# Patient Record
Sex: Female | Born: 1980 | Race: Black or African American | Hispanic: No
Health system: Midwestern US, Community
[De-identification: ages and names within clinical notes are randomized; demographics above are authoritative.]

## PROBLEM LIST (undated history)

## (undated) ENCOUNTER — Inpatient Hospital Stay (HOSPITAL_COMMUNITY): Payer: Self-pay

## (undated) DIAGNOSIS — R87629 Unspecified abnormal cytological findings in specimens from vagina: Secondary | ICD-10-CM

## (undated) DIAGNOSIS — A599 Trichomoniasis, unspecified: Secondary | ICD-10-CM

## (undated) DIAGNOSIS — Z8632 Personal history of gestational diabetes: Secondary | ICD-10-CM

## (undated) HISTORY — PX: WISDOM TOOTH EXTRACTION: SHX21

## (undated) HISTORY — DX: Personal history of gestational diabetes: Z86.32

## (undated) HISTORY — DX: Trichomoniasis, unspecified: A59.9

---

## 2010-11-22 DIAGNOSIS — O159 Eclampsia, unspecified as to time period: Secondary | ICD-10-CM

## 2015-09-26 LAB — OB RESULTS CONSOLE RUBELLA ANTIBODY, IGM: Rubella: IMMUNE

## 2015-09-26 LAB — OB RESULTS CONSOLE HIV ANTIBODY (ROUTINE TESTING): HIV: NONREACTIVE

## 2015-09-26 LAB — OB RESULTS CONSOLE ABO/RH: RH TYPE: POSITIVE

## 2015-09-26 LAB — OB RESULTS CONSOLE HGB/HCT, BLOOD
HCT: 39 %
Hemoglobin: 12.8 g/dL

## 2015-09-26 LAB — OB RESULTS CONSOLE PLATELET COUNT: PLATELETS: 235 10*3/uL

## 2015-09-26 LAB — OB RESULTS CONSOLE RPR: RPR: NONREACTIVE

## 2015-09-26 LAB — OB RESULTS CONSOLE GC/CHLAMYDIA
Chlamydia: NEGATIVE
Gonorrhea: NEGATIVE

## 2015-09-26 LAB — OB RESULTS CONSOLE HEPATITIS B SURFACE ANTIGEN: HEP B S AG: NEGATIVE

## 2015-09-26 LAB — OB RESULTS CONSOLE ANTIBODY SCREEN: Antibody Screen: NEGATIVE

## 2015-09-29 ENCOUNTER — Encounter: Payer: Self-pay | Admitting: *Deleted

## 2015-09-29 DIAGNOSIS — G43909 Migraine, unspecified, not intractable, without status migrainosus: Secondary | ICD-10-CM | POA: Insufficient documentation

## 2015-09-29 DIAGNOSIS — O4690 Antepartum hemorrhage, unspecified, unspecified trimester: Secondary | ICD-10-CM | POA: Insufficient documentation

## 2015-09-29 DIAGNOSIS — O09219 Supervision of pregnancy with history of pre-term labor, unspecified trimester: Secondary | ICD-10-CM

## 2015-09-29 DIAGNOSIS — Z8759 Personal history of other complications of pregnancy, childbirth and the puerperium: Secondary | ICD-10-CM

## 2015-09-29 DIAGNOSIS — O09899 Supervision of other high risk pregnancies, unspecified trimester: Secondary | ICD-10-CM

## 2015-09-29 DIAGNOSIS — O0990 Supervision of high risk pregnancy, unspecified, unspecified trimester: Secondary | ICD-10-CM | POA: Insufficient documentation

## 2015-09-29 DIAGNOSIS — Z8751 Personal history of pre-term labor: Secondary | ICD-10-CM | POA: Insufficient documentation

## 2015-10-11 NOTE — L&D Delivery Note (Signed)
Operative Delivery Note Called at 1:30 PM for repetitive variables since 1:14 PM not resolving w/ position change. At 1:44 PM anterior lip reduced and pushing started although pt not able to push well due to heavy epidural. At 2:10 PM a viable female was delivered via Vaginal, Spontaneous Delivery w/ 1 minute 20 second shoulder dystocia.  Presentation: vertex; Position: Right,, Occiput,, Anterior   Delivery of the head: 2:08 PM First maneuver: Gentle downward head traction   Second maneuver: McRoberts Third maneuver: Suprapubic pressure. NICU team called.  Fourth maneuver: Attempted wood screw maneuver, but unable to rotate shoulders. (Attending called, but called off due to successful delivery of infant a few seconds later.) Fifth maneuver: Hooked right axilla, rotated right shoulder clockwise after two attempts.  Sixth maneuver: Deliverer posterior (right) arm  Pt unable to to push effectively throughout entire event. Infant briefly placed skin-to-skin w/ mom, breathing, but did not transition quickly w/ stimulation and suctioning so cord clamped x 2 and cut and infant placed in warmer. See NICU note. Moving both arms well.   APGAR: 8, 9; weight 7 lb 11.6 oz (3505 g).   Placenta status: Intact, Spontaneous.   Cord: 3 vessels with the following complications: None.  Cord pH: collected.   Anesthesia: Epidural  Episiotomy: None Lacerations: None Suture Repair: NA Est. Blood Loss (mL): 300  Mom to postpartum.  Baby to Couplet care / Skin to Skin. Placenta to: pathology Feeding: Bottle Circ: NA Contraception: Changed her mind about BTL. Now planning Nexplanon.   Katrina Howard 03/27/2016, 8:04 AM

## 2015-10-13 ENCOUNTER — Encounter: Payer: Self-pay | Admitting: Family Medicine

## 2015-10-14 ENCOUNTER — Encounter: Payer: Self-pay | Admitting: Certified Nurse Midwife

## 2015-10-14 ENCOUNTER — Ambulatory Visit (INDEPENDENT_AMBULATORY_CARE_PROVIDER_SITE_OTHER): Payer: Medicaid Other | Admitting: Certified Nurse Midwife

## 2015-10-14 VITALS — BP 109/71 | HR 81 | Temp 98.2°F | Ht 65.0 in | Wt 183.0 lb

## 2015-10-14 DIAGNOSIS — O0992 Supervision of high risk pregnancy, unspecified, second trimester: Secondary | ICD-10-CM

## 2015-10-14 DIAGNOSIS — Z8759 Personal history of other complications of pregnancy, childbirth and the puerperium: Secondary | ICD-10-CM

## 2015-10-14 LAB — POCT URINALYSIS DIP (DEVICE)
Bilirubin Urine: NEGATIVE
GLUCOSE, UA: NEGATIVE mg/dL
Hgb urine dipstick: NEGATIVE
KETONES UR: NEGATIVE mg/dL
Leukocytes, UA: NEGATIVE
Nitrite: NEGATIVE
PROTEIN: NEGATIVE mg/dL
Specific Gravity, Urine: 1.025 (ref 1.005–1.030)
Urobilinogen, UA: 0.2 mg/dL (ref 0.0–1.0)
pH: 5.5 (ref 5.0–8.0)

## 2015-10-14 NOTE — Patient Instructions (Signed)

## 2015-10-14 NOTE — Progress Notes (Signed)
   Subjective:    Katrina Howard is a [id number] being seen today for her first obstetrical visit.  Her obstetrical history is significant for pre-eclampsia and Preterm delivery @ 36 weeks and abruption @ 20 weeks. Patient does intend to breast feed. Pregnancy history fully reviewed.  Patient reports no complaints.  Filed Vitals:   10/14/15 0857 10/14/15 0858  BP: 109/71   Pulse: 81   Temp: 98.2 F (36.8 C)   Height:  5\' 5"  (1.651 m)  Weight: 183 lb (83.008 kg)     HISTORY: OB History  Gravida Para Term Preterm AB SAB TAB Ectopic Multiple Living  4 3 1 2  0 0 0 0 0 2    # Outcome Date GA Lbr Len/2nd Weight Sex Delivery Anes PTL Lv  4 Current           3 Term 11/22/10 723w0d  5 lb 13 oz (2.637 kg) M Vag-Spont EPI N Y     Complications: Eclampsia (toxemia of pregnancy)     Comments: seizures during labor  2 Preterm 12/24/06 287w0d  6 lb 15 oz (3.147 kg) M Vag-Spont EPI Y Y  1 Preterm 06/11/03 4826w0d  1 lb (0.454 kg) M Vag-Spont Other Y FD     Complications: Abruptio Placenta     History reviewed. No pertinent past medical history. History reviewed. No pertinent past surgical history. Family History  Problem Relation Age of Onset  . Hypertension Mother   . Diabetes Father      Exam    Uterus:     Pelvic Exam:    Perineum:    Vulva:    Vagina:     pH:    Cervix:    Adnexa:    Bony Pelvis:   System: Breast:     Skin: normal coloration and turgor, no rashes    Neurologic: oriented, normal   Extremities: normal strength, tone, and muscle mass   HEENT    Mouth/Teeth mucous membranes moist, pharynx normal without lesions   Neck supple and no masses   Cardiovascular: regular rate and rhythm   Respiratory:  appears well, vitals normal, no respiratory distress, acyanotic, normal RR, ear and throat exam is normal, neck free of mass or lymphadenopathy, chest clear, no wheezing, crepitations, rhonchi, normal symmetric air entry   Abdomen: soft, non-tender; bowel sounds  normal; no masses,  no organomegaly   Urinary:       Assessment:    Pregnancy: [id number] Patient Active Problem List   Diagnosis Date Noted  . Supervision of high-risk pregnancy 09/29/2015  . History of preterm delivery, currently pregnant 09/29/2015  . History of eclampsia 09/29/2015  . Migraine 09/29/2015  . Vaginal bleeding during pregnancy, antepartum 09/29/2015        Plan:      Prenatal vitamins. Problem list reviewed and updated. Genetic Screening discussed Quad Screen: ordered.  Ultrasound discussed; fetal survey: ordered.  Follow up in 4 weeks. 50% of 30 min visit spent on counseling and coordination of care.  Candidate for 17-P; Pt wants to Baseline Preeclampsia labs CBC  CMP 24 hour urine   Katrina Howard 10/14/2015

## 2015-10-14 NOTE — Progress Notes (Signed)
Patient reports recent spotting & off and on cramping/pressure

## 2015-10-14 NOTE — Assessment & Plan Note (Signed)
Baseline Preeclampsia labs 10/14/15

## 2015-10-15 LAB — AFP, QUAD SCREEN
AFP: 66.4 ng/mL
Age Alone: 1:507 {titer}
Curr Gest Age: 15.4 wks.days
Down Syndrome Scr Risk Est: <1:38500 {titer}
HCG, Total: 27.85 IU/mL
INH: 156 pg/mL
Interpretation-AFP: NEGATIVE
MoM for AFP: 2.07
MoM for INH: 0.95
MoM for hCG: 0.67
Open Spina bifida: NEGATIVE
Osb Risk: 1:1270 {titer}
Tri 18 Scr Risk Est: NEGATIVE
Trisomy 18 (Edward) Syndrome Interp.: 1:17700 {titer}
uE3 Mom: 0.87
uE3 Value: 0.62 ng/mL

## 2015-10-21 ENCOUNTER — Ambulatory Visit: Payer: Medicaid Other

## 2015-10-21 VITALS — BP 111/66 | HR 70 | Temp 97.8°F

## 2015-10-21 DIAGNOSIS — Z3490 Encounter for supervision of normal pregnancy, unspecified, unspecified trimester: Secondary | ICD-10-CM

## 2015-10-21 LAB — CBC
HCT: 36.6 % (ref 36.0–46.0)
Hemoglobin: 12.2 g/dL (ref 12.0–15.0)
MCH: 26.6 pg (ref 26.0–34.0)
MCHC: 33.3 g/dL (ref 30.0–36.0)
MCV: 79.9 fL (ref 78.0–100.0)
MPV: 10.3 fL (ref 8.6–12.4)
Platelets: 231 10*3/uL (ref 150–400)
RBC: 4.58 MIL/uL (ref 3.87–5.11)
RDW: 14.5 % (ref 11.5–15.5)
WBC: 7.3 10*3/uL (ref 4.0–10.5)

## 2015-10-21 LAB — COMPREHENSIVE METABOLIC PANEL
ALT: 16 U/L (ref 6–29)
AST: 17 U/L (ref 10–30)
Albumin: 3.3 g/dL — ABNORMAL LOW (ref 3.6–5.1)
Alkaline Phosphatase: 48 U/L (ref 33–115)
BUN: 7 mg/dL (ref 7–25)
CO2: 26 mmol/L (ref 20–31)
Calcium: 8.9 mg/dL (ref 8.6–10.2)
Chloride: 103 mmol/L (ref 98–110)
Creat: 0.76 mg/dL (ref 0.50–1.10)
Glucose, Bld: 74 mg/dL (ref 65–99)
Potassium: 4 mmol/L (ref 3.5–5.3)
Sodium: 135 mmol/L (ref 135–146)
Total Bilirubin: 0.3 mg/dL (ref 0.2–1.2)
Total Protein: 6.5 g/dL (ref 6.1–8.1)

## 2015-10-21 MED ORDER — HYDROXYPROGESTERONE CAPROATE 250 MG/ML IM OIL
250.0000 mg | TOPICAL_OIL | Freq: Once | INTRAMUSCULAR | Status: AC
  Administered 2015-10-21: 250 mg via INTRAMUSCULAR

## 2015-10-23 LAB — PROTEIN, URINE, 24 HOUR
Protein, 24H Urine: 143 mg/24 h (ref <150)
Protein, Urine: 15 mg/dL (ref 5–24)

## 2015-10-23 LAB — CREATININE CLEARANCE, URINE, 24 HOUR
Creatinine Clearance: 155 mL/min — ABNORMAL HIGH (ref 75–115)
Creatinine, 24H Ur: 1.65 g/(24.h) (ref 0.63–2.50)
Creatinine, Urine: 174 mg/dL (ref 20–320)
Creatinine: 0.74 mg/dL (ref 0.50–1.10)

## 2015-10-28 ENCOUNTER — Ambulatory Visit (INDEPENDENT_AMBULATORY_CARE_PROVIDER_SITE_OTHER): Payer: Medicaid Other | Admitting: General Practice

## 2015-10-28 VITALS — BP 114/63 | HR 86 | Temp 98.2°F | Ht 65.0 in | Wt 188.0 lb

## 2015-10-28 DIAGNOSIS — O09212 Supervision of pregnancy with history of pre-term labor, second trimester: Secondary | ICD-10-CM | POA: Diagnosis present

## 2015-10-28 DIAGNOSIS — O0992 Supervision of high risk pregnancy, unspecified, second trimester: Secondary | ICD-10-CM

## 2015-10-28 MED ORDER — HYDROXYPROGESTERONE CAPROATE 250 MG/ML IM OIL
250.0000 mg | TOPICAL_OIL | INTRAMUSCULAR | Status: AC
  Administered 2015-10-28 – 2016-03-03 (×19): 250 mg via INTRAMUSCULAR

## 2015-11-04 ENCOUNTER — Ambulatory Visit (INDEPENDENT_AMBULATORY_CARE_PROVIDER_SITE_OTHER): Payer: Medicaid Other | Admitting: General Practice

## 2015-11-04 DIAGNOSIS — O09212 Supervision of pregnancy with history of pre-term labor, second trimester: Secondary | ICD-10-CM | POA: Diagnosis present

## 2015-11-11 ENCOUNTER — Ambulatory Visit (INDEPENDENT_AMBULATORY_CARE_PROVIDER_SITE_OTHER): Payer: Medicaid Other | Admitting: Certified Nurse Midwife

## 2015-11-11 ENCOUNTER — Ambulatory Visit (HOSPITAL_COMMUNITY)
Admission: RE | Admit: 2015-11-11 | Discharge: 2015-11-11 | Disposition: A | Discharge status: Home / Self Care | Payer: Medicaid Other | Source: Ambulatory Visit | Attending: Certified Nurse Midwife | Admitting: Certified Nurse Midwife

## 2015-11-11 ENCOUNTER — Other Ambulatory Visit: Payer: Self-pay | Admitting: General Practice

## 2015-11-11 VITALS — BP 106/62 | HR 75 | Temp 98.3°F | Wt 189.3 lb

## 2015-11-11 DIAGNOSIS — Z8759 Personal history of other complications of pregnancy, childbirth and the puerperium: Secondary | ICD-10-CM

## 2015-11-11 DIAGNOSIS — Z3689 Encounter for other specified antenatal screening: Secondary | ICD-10-CM

## 2015-11-11 DIAGNOSIS — O09212 Supervision of pregnancy with history of pre-term labor, second trimester: Secondary | ICD-10-CM

## 2015-11-11 DIAGNOSIS — Z3A19 19 weeks gestation of pregnancy: Secondary | ICD-10-CM | POA: Diagnosis not present

## 2015-11-11 DIAGNOSIS — O0992 Supervision of high risk pregnancy, unspecified, second trimester: Secondary | ICD-10-CM

## 2015-11-11 DIAGNOSIS — Z36 Encounter for antenatal screening of mother: Secondary | ICD-10-CM | POA: Insufficient documentation

## 2015-11-11 DIAGNOSIS — O09292 Supervision of pregnancy with other poor reproductive or obstetric history, second trimester: Secondary | ICD-10-CM

## 2015-11-11 DIAGNOSIS — O09892 Supervision of other high risk pregnancies, second trimester: Secondary | ICD-10-CM

## 2015-11-11 LAB — POCT URINALYSIS DIP (DEVICE)
Bilirubin Urine: NEGATIVE
Glucose, UA: NEGATIVE mg/dL
Hgb urine dipstick: NEGATIVE
Ketones, ur: NEGATIVE mg/dL
Leukocytes, UA: NEGATIVE
Nitrite: NEGATIVE
Protein, ur: NEGATIVE mg/dL
Specific Gravity, Urine: 1.01 (ref 1.005–1.030)
Urobilinogen, UA: 0.2 mg/dL (ref 0.0–1.0)
pH: 7 (ref 5.0–8.0)

## 2015-11-11 NOTE — Progress Notes (Signed)
Subjective:  Katrina Howard is a 35 y.o. [id number] at [redacted]w[redacted]d being seen today for ongoing prenatal care.  She is currently monitored for the following issues for this high-risk pregnancy and has Supervision of high-risk pregnancy; History of preterm delivery, currently pregnant; History of eclampsia; Migraine; and Vaginal bleeding during pregnancy, antepartum on her problem list.  Patient reports no complaints.  Contractions: Irritability. Vag. Bleeding: None.  Movement: Present. Denies leaking of fluid.   The following portions of the patient's history were reviewed and updated as appropriate: allergies, current medications, past family history, past medical history, past social history, past surgical history and problem list. Problem list updated.  Objective:   Filed Vitals:   11/11/15 0951  BP: 106/62  Pulse: 75  Temp: 98.3 F (36.8 C)  Weight: 189 lb 4.8 oz (85.866 kg)    Fetal Status: Fetal Heart Rate (bpm): 145   Movement: Present     General:  Alert, oriented and cooperative. Patient is in no acute distress.  Skin: Skin is warm and dry. No rash noted.   Cardiovascular: Normal heart rate noted  Respiratory: Normal respiratory effort, no problems with respiration noted  Abdomen: Soft, gravid, appropriate for gestational age. Pain/Pressure: Present     Pelvic: Vag. Bleeding: None     Cervical exam deferred        Extremities: Normal range of motion.  Edema: None  Mental Status: Normal mood and affect. Normal behavior. Normal judgment and thought content.   Urinalysis: Urine Protein: Negative Urine Glucose: Negative  Assessment and Plan:  Pregnancy: [id number] at [redacted]w[redacted]d  1. History of eclampsia   2. History of preterm delivery, currently pregnant, second trimester 17-P injection  3. Supervision of high-risk pregnancy, second trimester   Preterm labor symptoms and general obstetric precautions including but not limited to vaginal bleeding, contractions, leaking of fluid and  fetal movement were reviewed in detail with the patient. Please refer to After Visit Summary for other counseling recommendations.  Return in about 4 weeks (around 12/09/2015).   Rhea Pink, CNM

## 2015-11-11 NOTE — Patient Instructions (Signed)

## 2015-11-11 NOTE — Progress Notes (Signed)
Breastfeeding tip of the week reviewed 17-p injection today

## 2015-11-19 ENCOUNTER — Ambulatory Visit (INDEPENDENT_AMBULATORY_CARE_PROVIDER_SITE_OTHER): Payer: Medicaid Other

## 2015-11-19 DIAGNOSIS — O09212 Supervision of pregnancy with history of pre-term labor, second trimester: Secondary | ICD-10-CM

## 2015-11-19 DIAGNOSIS — O09892 Supervision of other high risk pregnancies, second trimester: Secondary | ICD-10-CM

## 2015-11-26 ENCOUNTER — Ambulatory Visit (INDEPENDENT_AMBULATORY_CARE_PROVIDER_SITE_OTHER): Payer: Medicaid Other | Admitting: *Deleted

## 2015-11-26 VITALS — BP 111/59 | HR 88 | Temp 98.3°F | Wt 193.4 lb

## 2015-11-26 DIAGNOSIS — O09892 Supervision of other high risk pregnancies, second trimester: Secondary | ICD-10-CM

## 2015-11-26 DIAGNOSIS — O09212 Supervision of pregnancy with history of pre-term labor, second trimester: Secondary | ICD-10-CM | POA: Diagnosis present

## 2015-12-03 ENCOUNTER — Ambulatory Visit (INDEPENDENT_AMBULATORY_CARE_PROVIDER_SITE_OTHER): Payer: Medicaid Other | Admitting: *Deleted

## 2015-12-03 VITALS — BP 95/67 | HR 82 | Temp 98.5°F | Wt 191.2 lb

## 2015-12-03 DIAGNOSIS — O09892 Supervision of other high risk pregnancies, second trimester: Secondary | ICD-10-CM

## 2015-12-03 DIAGNOSIS — O09212 Supervision of pregnancy with history of pre-term labor, second trimester: Secondary | ICD-10-CM

## 2015-12-07 ENCOUNTER — Telehealth: Payer: Self-pay | Admitting: General Practice

## 2015-12-07 NOTE — Telephone Encounter (Signed)
Patient called and left message stating she is calling for a prescription for oral thrush. Patient states she currently has thrush in her mouth. Called patient and she states her mouth hurts really bad and she has a thick white coating in her mouth. Told patient she would need to see a provider for that for them to do an exam. Told patient we cannot send a medication in for that without seeing her. Patient states she cannot wait till her appt on Thursday. Offered nurse visit today or she could go to an urgent care closer to where she lives. Patient verbalized understanding & states she will go to an urgent care. Patient had no other questions

## 2015-12-10 ENCOUNTER — Ambulatory Visit (INDEPENDENT_AMBULATORY_CARE_PROVIDER_SITE_OTHER): Payer: Medicaid Other | Admitting: Obstetrics & Gynecology

## 2015-12-10 ENCOUNTER — Encounter: Payer: Self-pay | Admitting: Obstetrics & Gynecology

## 2015-12-10 ENCOUNTER — Other Ambulatory Visit: Payer: Self-pay | Admitting: Obstetrics & Gynecology

## 2015-12-10 VITALS — BP 107/66 | HR 76 | Temp 98.5°F | Wt 193.4 lb

## 2015-12-10 DIAGNOSIS — O4412 Placenta previa with hemorrhage, second trimester: Secondary | ICD-10-CM | POA: Diagnosis not present

## 2015-12-10 DIAGNOSIS — O09212 Supervision of pregnancy with history of pre-term labor, second trimester: Secondary | ICD-10-CM

## 2015-12-10 DIAGNOSIS — O4440 Low lying placenta NOS or without hemorrhage, unspecified trimester: Secondary | ICD-10-CM | POA: Insufficient documentation

## 2015-12-10 DIAGNOSIS — O0992 Supervision of high risk pregnancy, unspecified, second trimester: Secondary | ICD-10-CM

## 2015-12-10 DIAGNOSIS — B370 Candidal stomatitis: Secondary | ICD-10-CM

## 2015-12-10 DIAGNOSIS — O4402 Placenta previa specified as without hemorrhage, second trimester: Secondary | ICD-10-CM

## 2015-12-10 LAB — POCT URINALYSIS DIP (DEVICE)
Bilirubin Urine: NEGATIVE
GLUCOSE, UA: NEGATIVE mg/dL
HGB URINE DIPSTICK: NEGATIVE
KETONES UR: NEGATIVE mg/dL
Leukocytes, UA: NEGATIVE
Nitrite: NEGATIVE
PH: 6.5 (ref 5.0–8.0)
PROTEIN: NEGATIVE mg/dL
SPECIFIC GRAVITY, URINE: 1.025 (ref 1.005–1.030)
Urobilinogen, UA: 0.2 mg/dL (ref 0.0–1.0)

## 2015-12-10 MED ORDER — NYSTATIN 100000 UNIT/ML MT SUSP: 5.0000 mL | Freq: Four times a day (QID) | OROMUCOSAL | Status: DC

## 2015-12-10 NOTE — Patient Instructions (Addendum)
Second Trimester of Pregnancy The second trimester is from week 13 through week 28, months 4 through 6. The second trimester is often a time when you feel your best. Your body has also adjusted to being pregnant, and you begin to feel better physically. Usually, morning sickness has lessened or quit completely, you may have more energy, and you may have an increase in appetite. The second trimester is also a time when the fetus is growing rapidly. At the end of the sixth month, the fetus is about 9 inches long and weighs about 1 pounds. You will likely begin to feel the baby move (quickening) between 18 and 20 weeks of the pregnancy. BODY CHANGES Your body goes through many changes during pregnancy. The changes vary from woman to woman.  1. Your weight will continue to increase. You will notice your lower abdomen bulging out. 2. You may begin to get stretch marks on your hips, abdomen, and breasts. 3. You may develop headaches that can be relieved by medicines approved by your health care provider. 4. You may urinate more often because the fetus is pressing on your bladder. 5. You may develop or continue to have heartburn as a result of your pregnancy. 6. You may develop constipation because certain hormones are causing the muscles that push waste through your intestines to slow down. 7. You may develop hemorrhoids or swollen, bulging veins (varicose veins). 8. You may have back pain because of the weight gain and pregnancy hormones relaxing your joints between the bones in your pelvis and as a result of a shift in weight and the muscles that support your balance. 9. Your breasts will continue to grow and be tender. 10. Your gums may bleed and may be sensitive to brushing and flossing. 11. Dark spots or blotches (chloasma, mask of pregnancy) may develop on your face. This will likely fade after the baby is born. 12. A dark line from your belly button to the pubic area (linea nigra) may appear. This  will likely fade after the baby is born. 13. You may have changes in your hair. These can include thickening of your hair, rapid growth, and changes in texture. Some women also have hair loss during or after pregnancy, or hair that feels dry or thin. Your hair will most likely return to normal after your baby is born. WHAT TO EXPECT AT YOUR PRENATAL VISITS During a routine prenatal visit:  You will be weighed to make sure you and the fetus are growing normally.  Your blood pressure will be taken.  Your abdomen will be measured to track your baby's growth.  The fetal heartbeat will be listened to.  Any test results from the previous visit will be discussed. Your health care provider may ask you:  How you are feeling.  If you are feeling the baby move.  If you have had any abnormal symptoms, such as leaking fluid, bleeding, severe headaches, or abdominal cramping.  If you are using any tobacco products, including cigarettes, chewing tobacco, and electronic cigarettes.  If you have any questions. Other tests that may be performed during your second trimester include:  Blood tests that check for:  Low iron levels (anemia).  Gestational diabetes (between 24 and 28 weeks).  Rh antibodies.  Urine tests to check for infections, diabetes, or protein in the urine.  An ultrasound to confirm the proper growth and development of the baby.  An amniocentesis to check for possible genetic problems.  Fetal screens for spina bifida  and Down syndrome.  HIV (human immunodeficiency virus) testing. Routine prenatal testing includes screening for HIV, unless you choose not to have this test. HOME CARE INSTRUCTIONS   Avoid all smoking, herbs, alcohol, and unprescribed drugs. These chemicals affect the formation and growth of the baby.  Do not use any tobacco products, including cigarettes, chewing tobacco, and electronic cigarettes. If you need help quitting, ask your health care provider.  You may receive counseling support and other resources to help you quit.  Follow your health care provider's instructions regarding medicine use. There are medicines that are either safe or unsafe to take during pregnancy.  Exercise only as directed by your health care provider. Experiencing uterine cramps is a good sign to stop exercising.  Continue to eat regular, healthy meals.  Wear a good support bra for breast tenderness.  Do not use hot tubs, steam rooms, or saunas.  Wear your seat belt at all times when driving.  Avoid raw meat, uncooked cheese, cat litter boxes, and soil used by cats. These carry germs that can cause birth defects in the baby.  Take your prenatal vitamins.  Take 1500-2000 mg of calcium daily starting at the 20th week of pregnancy until you deliver your baby.  Try taking a stool softener (if your health care provider approves) if you develop constipation. Eat more high-fiber foods, such as fresh vegetables or fruit and whole grains. Drink plenty of fluids to keep your urine clear or pale yellow.  Take warm sitz baths to soothe any pain or discomfort caused by hemorrhoids. Use hemorrhoid cream if your health care provider approves.  If you develop varicose veins, wear support hose. Elevate your feet for 15 minutes, 3-4 times a day. Limit salt in your diet.  Avoid heavy lifting, wear low heel shoes, and practice good posture.  Rest with your legs elevated if you have leg cramps or low back pain.  Visit your dentist if you have not gone yet during your pregnancy. Use a soft toothbrush to brush your teeth and be gentle when you floss.  A sexual relationship may be continued unless your health care provider directs you otherwise.  Continue to go to all your prenatal visits as directed by your health care provider. SEEK MEDICAL CARE IF:   You have dizziness.  You have mild pelvic cramps, pelvic pressure, or nagging pain in the abdominal area.  You have  persistent nausea, vomiting, or diarrhea.  You have a bad smelling vaginal discharge.  You have pain with urination. SEEK IMMEDIATE MEDICAL CARE IF:   You have a fever.  You are leaking fluid from your vagina.  You have spotting or bleeding from your vagina.  You have severe abdominal cramping or pain.  You have rapid weight gain or loss.  You have shortness of breath with chest pain.  You notice sudden or extreme swelling of your face, hands, ankles, feet, or legs.  You have not felt your baby move in over an hour.  You have severe headaches that do not go away with medicine.  You have vision changes.   This information is not intended to replace advice given to you by your health care provider. Make sure you discuss any questions you have with your health care provider.   Document Released: 09/20/2001 Document Revised: 10/17/2014 Document Reviewed: 11/27/2012 Elsevier Interactive Patient Education 2016 [employer] Placenta Previa Placenta previa is a condition in pregnant women where the placenta implants in the lower part of the uterus. The  placenta either partially or completely covers the opening to the cervix. This is a problem because the baby must pass through the cervix during delivery. There are three types of placenta previa. They include:  14. Marginal placenta previa. The placenta is near the cervix, but does not cover the opening. 15. Partial placenta previa. The placenta covers part of the cervical opening. 16. Complete placenta previa. The placenta covers the entire cervical opening.  Depending on the type of placenta previa, there is a chance the placenta may move into a normal position and no longer cover the cervix as the pregnancy progresses. It is important to keep all prenatal visits with your caregiver.  RISK FACTORS You may be more likely to develop placenta previa if you:   Are carrying more than one baby (multiples).   Have an abnormally shaped  uterus.   Have scars on the lining of the uterus.   Had previous surgeries involving the uterus, such as a cesarean delivery.   Have delivered a baby previously.   Have a history of placenta previa.   Have smoked or used cocaine during pregnancy.   Are age 64 or older during pregnancy.  SYMPTOMS The main symptom of placenta previa is sudden, painless vaginal bleeding during the second half of pregnancy. The amount of bleeding can be light to very heavy. The bleeding may stop on its own, but almost always returns. Cramping, regular contractions, abdominal pain, and lower back pain can also occur with placenta previa.  DIAGNOSIS Placenta previa can be diagnosed through an ultrasound by finding where the placenta is located. The ultrasound may find placenta previa either during a routine prenatal visit or after vaginal bleeding is noticed. If you are diagnosed with placenta previa, your caregiver may avoid vaginal exams to reduce the risk of heavy bleeding. There is a chance that placenta previa may not be diagnosed until bleeding occurs during labor.  TREATMENT Specific treatment depends on:   How much you are bleeding or if the bleeding has stopped.  How far along you are in your pregnancy.   The condition of the baby.   The location of the baby and placenta.   The type of placenta previa.  Depending on the factors above, your caregiver may recommend:   Decreased activity.   Bed rest at home or in the hospital.  Pelvic rest. This means no sex, using tampons, douching, pelvic exams, or placing anything into the vagina.  A blood transfusion to replace maternal blood loss.  A cesarean delivery if the bleeding is heavy and cannot be controlled or the placenta completely covers the cervix.  Medication to stop premature labor or mature the fetal lungs if delivery is needed before the pregnancy is full term.  WHEN SHOULD YOU SEEK IMMEDIATE MEDICAL CARE IF YOU ARE SENT  HOME WITH PLACENTA PREVIA? Seek immediate medical care if you show any symptoms of placenta previa. You will need to go to the hospital to get checked immediately. Again, those symptoms are:  Sudden, painless vaginal bleeding, even a small amount.  Cramping or regular contractions.  Lower back or abdominal pain.   This information is not intended to replace advice given to you by your health care provider. Make sure you discuss any questions you have with your health care provider.   Document Released: 09/26/2005 Document Revised: 10/17/2014 Document Reviewed: 12/28/2012 Elsevier Interactive Patient Education [employer]

## 2015-12-10 NOTE — Progress Notes (Signed)
Pt would like to be check for Thrush in her mouth.

## 2015-12-10 NOTE — Progress Notes (Signed)
Subjective:  Katrina Howard is a 35 y.o. [phone] at [redacted]w[redacted]d being seen today for ongoing prenatal care.  She is currently monitored for the following issues for this high-risk pregnancy and has Supervision of high-risk pregnancy; History of preterm delivery, currently pregnant; History of eclampsia; Migraine; Vaginal bleeding during pregnancy, antepartum; and Placenta previa antepartum on her problem list. C/o oral thrush  Patient reports pressure and few Uc.  Contractions: Irritability. Vag. Bleeding: None.  Movement: Present. Denies leaking of fluid.   The following portions of the patient's history were reviewed and updated as appropriate: allergies, current medications, past family history, past medical history, past social history, past surgical history and problem list. Problem list updated.  Objective:   Filed Vitals:   12/10/15 0903  BP: 107/66  Pulse: 76  Temp: 98.5 F (36.9 C)  Weight: 193 lb 6.4 oz (87.726 kg)    Fetal Status: Fetal Heart Rate (bpm): 145   Movement: Present     General:  Alert, oriented and cooperative. Patient is in no acute distress.  Skin: Skin is warm and dry. No rash noted.   Cardiovascular: Normal heart rate noted  Respiratory: Normal respiratory effort, no problems with respiration noted  Abdomen: Soft, gravid, appropriate for gestational age. Pain/Pressure: Absent     Pelvic: Vag. Bleeding: None     Cervical exam deferred        Extremities: Normal range of motion.  Edema: None  Mental Status: Normal mood and affect. Normal behavior. Normal judgment and thought content.   Urinalysis:      Assessment and Plan:  Pregnancy: [id number] at [redacted]w[redacted]d  1. Supervision of high-risk pregnancy, second trimester H/o PTB - US OB Follow Up; Future  2. Placenta previa antepartum, second trimester Precautions given  Preterm labor symptoms and general obstetric precautions including but not limited to vaginal bleeding, contractions, leaking of fluid and fetal  movement were reviewed in detail with the patient. Please refer to After Visit Summary for other counseling recommendations.  Return in about 4 weeks (around 01/07/2016).   Adam Phenix, MD

## 2015-12-17 ENCOUNTER — Ambulatory Visit (INDEPENDENT_AMBULATORY_CARE_PROVIDER_SITE_OTHER): Payer: Medicaid Other | Admitting: *Deleted

## 2015-12-17 ENCOUNTER — Encounter: Payer: Self-pay | Admitting: *Deleted

## 2015-12-17 VITALS — BP 102/55 | HR 85 | Temp 98.5°F | Wt 193.9 lb

## 2015-12-17 DIAGNOSIS — O09212 Supervision of pregnancy with history of pre-term labor, second trimester: Secondary | ICD-10-CM | POA: Diagnosis present

## 2015-12-17 DIAGNOSIS — O09892 Supervision of other high risk pregnancies, second trimester: Secondary | ICD-10-CM

## 2015-12-21 MED ORDER — NYSTATIN 100000 UNIT/ML MT SUSP: 5.0000 mL | Freq: Four times a day (QID) | OROMUCOSAL | Status: DC

## 2015-12-24 ENCOUNTER — Encounter: Payer: Self-pay | Admitting: *Deleted

## 2015-12-24 ENCOUNTER — Ambulatory Visit (INDEPENDENT_AMBULATORY_CARE_PROVIDER_SITE_OTHER): Payer: Medicaid Other | Admitting: *Deleted

## 2015-12-24 VITALS — BP 103/57 | HR 84

## 2015-12-24 DIAGNOSIS — O09213 Supervision of pregnancy with history of pre-term labor, third trimester: Secondary | ICD-10-CM

## 2015-12-31 ENCOUNTER — Ambulatory Visit (INDEPENDENT_AMBULATORY_CARE_PROVIDER_SITE_OTHER): Payer: Medicaid Other | Admitting: Advanced Practice Midwife

## 2015-12-31 ENCOUNTER — Encounter: Payer: Self-pay | Admitting: Advanced Practice Midwife

## 2015-12-31 ENCOUNTER — Ambulatory Visit (INDEPENDENT_AMBULATORY_CARE_PROVIDER_SITE_OTHER): Payer: Medicaid Other

## 2015-12-31 VITALS — BP 107/61 | HR 69 | Wt 194.9 lb

## 2015-12-31 VITALS — BP 111/76 | HR 92 | Temp 98.6°F | Wt 196.7 lb

## 2015-12-31 DIAGNOSIS — O09213 Supervision of pregnancy with history of pre-term labor, third trimester: Secondary | ICD-10-CM | POA: Diagnosis present

## 2015-12-31 DIAGNOSIS — O9989 Other specified diseases and conditions complicating pregnancy, childbirth and the puerperium: Secondary | ICD-10-CM

## 2015-12-31 DIAGNOSIS — N898 Other specified noninflammatory disorders of vagina: Secondary | ICD-10-CM

## 2015-12-31 DIAGNOSIS — O09299 Supervision of pregnancy with other poor reproductive or obstetric history, unspecified trimester: Secondary | ICD-10-CM

## 2015-12-31 LAB — POCT URINALYSIS DIP (DEVICE)
Bilirubin Urine: NEGATIVE
Glucose, UA: NEGATIVE mg/dL
HGB URINE DIPSTICK: NEGATIVE
Ketones, ur: NEGATIVE mg/dL
LEUKOCYTES UA: NEGATIVE
NITRITE: NEGATIVE
PH: 7 (ref 5.0–8.0)
PROTEIN: NEGATIVE mg/dL
Specific Gravity, Urine: 1.025 (ref 1.005–1.030)
UROBILINOGEN UA: 1 mg/dL (ref 0.0–1.0)

## 2015-12-31 MED ORDER — ASPIRIN EC 81 MG PO TBEC: 81.0000 mg | DELAYED_RELEASE_TABLET | Freq: Every day | ORAL | Status: DC

## 2015-12-31 NOTE — Progress Notes (Signed)
Patient ID: Katrina Howard, female   DOB: 02/01/1981, 35 y.o.   MRN: [id number] S: Here for vaginal discharge w/ odor and burning x a few days. Concern for yeast infection. No VB. Known previa. Was afraid to try Monistat due to previa.   O:  BP 111/76 mmHg  Pulse 92  Temp(Src) 98.6 F (37 C) (Oral)  Wt 196 lb 11.2 oz (89.223 kg)  LMP 04/10/2015 FHR 134 Pelvic NEFG. No visible discharge. Wet prep carefully collected.   A: Vaginitis.   P:  Wet prep pending.  F/U 1 week   [place] Herndon Grill, [place] 12/31/2015 12:23 PM

## 2016-01-01 LAB — WET PREP, GENITAL
Trich, Wet Prep: NONE SEEN
Yeast Wet Prep HPF POC: NONE SEEN

## 2016-01-03 ENCOUNTER — Other Ambulatory Visit: Payer: Self-pay | Admitting: Advanced Practice Midwife

## 2016-01-03 DIAGNOSIS — N760 Acute vaginitis: Principal | ICD-10-CM

## 2016-01-03 DIAGNOSIS — B9689 Other specified bacterial agents as the cause of diseases classified elsewhere: Secondary | ICD-10-CM

## 2016-01-03 MED ORDER — METRONIDAZOLE 500 MG PO TABS: 500.0000 mg | ORAL_TABLET | Freq: Two times a day (BID) | ORAL | Status: DC

## 2016-01-03 NOTE — Progress Notes (Signed)
DX BV.  Rx Flagyl. 

## 2016-01-03 NOTE — Progress Notes (Deleted)
Subjective:     Patient ID: Katrina Howard, female   DOB: 07/22/1981, 35 y.o.   MRN: [id number]  HPI patient is 26 weeks 5 days gestation here with complaint of vaginal discharge and irritation. Denies leaking of fluid, abdominal pain or vaginal bleeding.   Review of Systems  Constitutional: Negative for fever and chills.  Gastrointestinal: Negative for abdominal pain.  Genitourinary: Positive for vaginal discharge. Negative for dysuria, vaginal bleeding, genital sores, vaginal pain and pelvic pain.  Musculoskeletal: Negative for back pain.       Objective: LMP 04/10/2015  Fetal heart rate 163 by Doppler   Physical Exam  Constitutional: She is oriented to person, place, and time. She appears well-developed and well-nourished.  Pulmonary/Chest: No respiratory distress.  Abdominal: Soft. There is no tenderness.  Genitourinary: There is no rash on the right labia. There is no rash on the left labia. No erythema or bleeding in the vagina. Vaginal discharge (small amountof thin, white, odorless discharge) found.  Musculoskeletal: She exhibits no edema.  Neurological: She is alert and oriented to person, place, and time.  Skin: Skin is warm and dry.  Psychiatric: She has a normal mood and affect.  Wet prep swab partially, gently inserted into vagina, avoiding cervix due to known previa.     Assessment:     Vaginal discharge pregnancy    Plan:     Wet prep pending   follow-up in one week for 17-P

## 2016-01-04 ENCOUNTER — Telehealth: Payer: Self-pay | Admitting: Obstetrics and Gynecology

## 2016-01-04 NOTE — Telephone Encounter (Signed)
Pt informed of lab results. 

## 2016-01-04 NOTE — Telephone Encounter (Signed)
Patient want a call, she said she did not receive a call about her test results and a RX was called in for her and she want to know why

## 2016-01-04 NOTE — Progress Notes (Signed)
Pt has been informed of lab results. 

## 2016-01-07 ENCOUNTER — Encounter (HOSPITAL_COMMUNITY): Payer: Self-pay

## 2016-01-07 ENCOUNTER — Ambulatory Visit (HOSPITAL_COMMUNITY)
Admission: RE | Admit: 2016-01-07 | Discharge: 2016-01-07 | Disposition: A | Discharge status: Home / Self Care | Payer: Medicaid Other | Source: Ambulatory Visit | Attending: Obstetrics & Gynecology | Admitting: Obstetrics & Gynecology

## 2016-01-07 ENCOUNTER — Ambulatory Visit (INDEPENDENT_AMBULATORY_CARE_PROVIDER_SITE_OTHER): Payer: Medicaid Other | Admitting: Obstetrics and Gynecology

## 2016-01-07 VITALS — BP 115/65 | HR 105 | Wt 196.5 lb

## 2016-01-07 VITALS — BP 124/73 | HR 92 | Temp 97.9°F | Wt 197.8 lb

## 2016-01-07 DIAGNOSIS — O0990 Supervision of high risk pregnancy, unspecified, unspecified trimester: Secondary | ICD-10-CM

## 2016-01-07 DIAGNOSIS — O09292 Supervision of pregnancy with other poor reproductive or obstetric history, second trimester: Secondary | ICD-10-CM | POA: Diagnosis present

## 2016-01-07 DIAGNOSIS — O4400 Placenta previa specified as without hemorrhage, unspecified trimester: Secondary | ICD-10-CM

## 2016-01-07 DIAGNOSIS — O99612 Diseases of the digestive system complicating pregnancy, second trimester: Secondary | ICD-10-CM

## 2016-01-07 DIAGNOSIS — Z8759 Personal history of other complications of pregnancy, childbirth and the puerperium: Secondary | ICD-10-CM | POA: Insufficient documentation

## 2016-01-07 DIAGNOSIS — Z3A27 27 weeks gestation of pregnancy: Secondary | ICD-10-CM | POA: Insufficient documentation

## 2016-01-07 DIAGNOSIS — O4402 Placenta previa specified as without hemorrhage, second trimester: Secondary | ICD-10-CM

## 2016-01-07 DIAGNOSIS — O09212 Supervision of pregnancy with history of pre-term labor, second trimester: Secondary | ICD-10-CM

## 2016-01-07 DIAGNOSIS — O09892 Supervision of other high risk pregnancies, second trimester: Secondary | ICD-10-CM

## 2016-01-07 DIAGNOSIS — K219 Gastro-esophageal reflux disease without esophagitis: Secondary | ICD-10-CM

## 2016-01-07 DIAGNOSIS — O09299 Supervision of pregnancy with other poor reproductive or obstetric history, unspecified trimester: Secondary | ICD-10-CM | POA: Diagnosis not present

## 2016-01-07 DIAGNOSIS — O4690 Antepartum hemorrhage, unspecified, unspecified trimester: Secondary | ICD-10-CM

## 2016-01-07 LAB — POCT URINALYSIS DIP (DEVICE)
BILIRUBIN URINE: NEGATIVE
Glucose, UA: NEGATIVE mg/dL
HGB URINE DIPSTICK: NEGATIVE
KETONES UR: NEGATIVE mg/dL
LEUKOCYTES UA: NEGATIVE
NITRITE: NEGATIVE
Protein, ur: 30 mg/dL — AB
Specific Gravity, Urine: 1.025 (ref 1.005–1.030)
Urobilinogen, UA: 0.2 mg/dL (ref 0.0–1.0)
pH: 7 (ref 5.0–8.0)

## 2016-01-07 MED ORDER — RANITIDINE HCL 150 MG PO TABS: 150.0000 mg | ORAL_TABLET | Freq: Two times a day (BID) | ORAL | Status: DC

## 2016-01-07 MED ORDER — ASPIRIN EC 81 MG PO TBEC: 81.0000 mg | DELAYED_RELEASE_TABLET | Freq: Every day | ORAL | Status: DC

## 2016-01-07 NOTE — Progress Notes (Signed)
Subjective:  Katrina Howard is a 35 y.o. [phone] at 5762w5d being seen today for ongoing prenatal care.  She is currently monitored for the following issues for this high-risk pregnancy and has Supervision of high-risk pregnancy; History of preterm delivery, currently pregnant; History of eclampsia; Migraine; Vaginal bleeding during pregnancy, antepartum; Placenta previa antepartum; and History of placenta abruption on her problem list.  Patient reports no complaints.  Contractions: Not present. Vag. Bleeding: None.  Movement: Present. Denies leaking of fluid.   The following portions of the patient's history were reviewed and updated as appropriate: allergies, current medications, past family history, past medical history, past social history, past surgical history and problem list. Problem list updated.  Objective:   Filed Vitals:   01/07/16 0914  BP: 124/73  Pulse: 92  Temp: 97.9 F (36.6 C)  Weight: 197 lb 12.8 oz (89.721 kg)    Fetal Status: Fetal Heart Rate (bpm): 145   Movement: Present     General:  Alert, oriented and cooperative. Patient is in no acute distress.  Skin: Skin is warm and dry. No rash noted.   Cardiovascular: Normal heart rate noted  Respiratory: Normal respiratory effort, no problems with respiration noted  Abdomen: Soft, gravid, appropriate for gestational age. Pain/Pressure: Absent     Pelvic: Vag. Bleeding: None     Cervical exam deferred        Extremities: Normal range of motion.  Edema: None  Mental Status: Normal mood and affect. Normal behavior. Normal judgment and thought content.   Urinalysis:      Assessment and Plan:  Pregnancy: [id number] at 2162w5d  # Hx eclampsia - start baby aspirin  # Hx ptd - continuing 17-p  # Pregnancy - tdap next visit, strongly encouraged, declines today - discussed feeding/contraception. Wants larc - pt declines 1-hour and labs today, says will get next week along with cbc/hiv/rpr, strongly encouraged!  67#Previa -  u/s f/u today  #Gerd - tums not working - start ranitidine  Preterm labor symptoms and general obstetric precautions including but not limited to vaginal bleeding, contractions, leaking of fluid and fetal movement were reviewed in detail with the patient. Please refer to After Visit Summary for other counseling recommendations.  1 wk 17-p, 2 wks hr ob  Kathrynn RunningNoah Bedford Walton Digilio, MD

## 2016-01-07 NOTE — Progress Notes (Signed)
Pt is refusing to do glucose test today, states that she was no informed about it and needs to prepare herself for it. She will do it next week when she comes for her shot.  Would like an rx for reflux.

## 2016-01-07 NOTE — Assessment & Plan Note (Signed)
Questionable whether this occurred, with first baby, 20-week stillbirth

## 2016-01-14 ENCOUNTER — Ambulatory Visit (INDEPENDENT_AMBULATORY_CARE_PROVIDER_SITE_OTHER): Payer: Medicaid Other

## 2016-01-14 DIAGNOSIS — O0992 Supervision of high risk pregnancy, unspecified, second trimester: Secondary | ICD-10-CM

## 2016-01-14 DIAGNOSIS — O09212 Supervision of pregnancy with history of pre-term labor, second trimester: Secondary | ICD-10-CM

## 2016-01-14 LAB — CBC
HCT: 33.4 % — ABNORMAL LOW (ref 35.0–45.0)
HEMOGLOBIN: 11 g/dL — AB (ref 11.7–15.5)
MCH: 25.9 pg — AB (ref 27.0–33.0)
MCHC: 32.9 g/dL (ref 32.0–36.0)
MCV: 78.6 fL — ABNORMAL LOW (ref 80.0–100.0)
MPV: 9.9 fL (ref 7.5–12.5)
Platelets: 239 10*3/uL (ref 140–400)
RBC: 4.25 MIL/uL (ref 3.80–5.10)
RDW: 15.1 % — AB (ref 11.0–15.0)
WBC: 6.8 10*3/uL (ref 3.8–10.8)

## 2016-01-15 LAB — RPR

## 2016-01-15 LAB — HIV ANTIBODY (ROUTINE TESTING W REFLEX): HIV: NONREACTIVE

## 2016-01-15 LAB — GLUCOSE TOLERANCE, 1 HOUR (50G) W/O FASTING: GLUCOSE, 1 HR, GESTATIONAL: 142 mg/dL — AB (ref <140)

## 2016-01-19 ENCOUNTER — Telehealth: Payer: Self-pay | Admitting: *Deleted

## 2016-01-19 NOTE — Telephone Encounter (Signed)
Called pt and informed her of abnormal 1hr GTT which now requires 3hr GTT to determine if she has gestational diabetes. Pt stated that she will discuss with doctor at her appt on 4/13 because she does not want to have the test. I acknowledged pt's comment and stated that I will make a note for her appt.

## 2016-01-21 ENCOUNTER — Ambulatory Visit (INDEPENDENT_AMBULATORY_CARE_PROVIDER_SITE_OTHER): Payer: Medicaid Other | Admitting: Family

## 2016-01-21 VITALS — BP 120/62 | HR 96 | Temp 98.8°F | Wt 198.0 lb

## 2016-01-21 DIAGNOSIS — O09213 Supervision of pregnancy with history of pre-term labor, third trimester: Secondary | ICD-10-CM | POA: Diagnosis present

## 2016-01-21 DIAGNOSIS — Z8759 Personal history of other complications of pregnancy, childbirth and the puerperium: Secondary | ICD-10-CM

## 2016-01-21 DIAGNOSIS — O0993 Supervision of high risk pregnancy, unspecified, third trimester: Secondary | ICD-10-CM

## 2016-01-21 DIAGNOSIS — O26893 Other specified pregnancy related conditions, third trimester: Secondary | ICD-10-CM | POA: Diagnosis not present

## 2016-01-21 DIAGNOSIS — O09893 Supervision of other high risk pregnancies, third trimester: Secondary | ICD-10-CM

## 2016-01-21 DIAGNOSIS — O4443 Low lying placenta NOS or without hemorrhage, third trimester: Secondary | ICD-10-CM

## 2016-01-21 DIAGNOSIS — R51 Headache: Secondary | ICD-10-CM | POA: Diagnosis not present

## 2016-01-21 DIAGNOSIS — O4440 Low lying placenta NOS or without hemorrhage, unspecified trimester: Secondary | ICD-10-CM

## 2016-01-21 LAB — POCT URINALYSIS DIP (DEVICE)
Bilirubin Urine: NEGATIVE
Glucose, UA: NEGATIVE mg/dL
HGB URINE DIPSTICK: NEGATIVE
KETONES UR: NEGATIVE mg/dL
Leukocytes, UA: NEGATIVE
Nitrite: NEGATIVE
PH: 6.5 (ref 5.0–8.0)
PROTEIN: 30 mg/dL — AB
Urobilinogen, UA: 1 mg/dL (ref 0.0–1.0)

## 2016-01-21 MED ORDER — CYCLOBENZAPRINE HCL 10 MG PO TABS: 10.0000 mg | ORAL_TABLET | Freq: Every day | ORAL | Status: DC

## 2016-01-21 NOTE — Addendum Note (Signed)
Addended by: Kathee DeltonHILLMAN, CARRIE L on: 01/21/2016 10:22 AM   Modules accepted: Orders

## 2016-01-21 NOTE — Progress Notes (Signed)
Subjective:  Katrina Howard is a 35 y.o. [phone] Kuster2 at 5872w5d being seen today for ongoing prenatal care.  She is currently monitored for the following issues for this high-risk pregnancy and has Supervision of high-risk pregnancy; History of preterm delivery, currently pregnant; History of eclampsia; Migraine; Vaginal bleeding during pregnancy, antepartum; Low lying placenta, antepartum; History of placenta abruption; and GERD (gastroesophageal reflux disease) on her problem list.  Patient reports intermittent headaches.  Hx of migraines, Tylenol not helping.  Contractions: Irritability. Vag. Bleeding: None.  Movement: Present. Denies leaking of fluid.   The following portions of the patient's history were reviewed and updated as appropriate: allergies, current medications, past family history, past medical history, past social history, past surgical history and problem list. Problem list updated.  Objective:   Filed Vitals:   01/21/16 0812  BP: 120/62  Pulse: 96  Temp: 98.8 F (37.1 C)  Weight: 198 lb (89.812 kg)    Fetal Status: Fetal Heart Rate (bpm): 140 Fundal Height: 31 cm Movement: Present     General:  Alert, oriented and cooperative. Patient is in no acute distress.  Skin: Skin is warm and dry. No rash noted.   Cardiovascular: Normal heart rate noted  Respiratory: Normal respiratory effort, no problems with respiration noted  Abdomen: Soft, gravid, appropriate for gestational age. Pain/Pressure: Absent     Pelvic: Vag. Bleeding: None     Cervical exam deferred        Extremities: Normal range of motion.  Edema: None  Mental Status: Normal mood and affect. Normal behavior. Normal judgment and thought content.   Urinalysis: Urine Protein: Negative Urine Glucose: Negative  Assessment and Plan:  Pregnancy: [id number] at 3372w5d  1. History of preterm delivery, currently pregnant, third trimester - 17-p  2. Supervision of high-risk pregnancy, third trimester - Tdap today - 3 hr  today  3. Low lying placenta, antepartum - Reviewed ultrasound results   4. Headache in pregnancy, antepartum, third trimester - cyclobenzaprine (FLEXERIL) 10 MG tablet; Take 1 tablet (10 mg total) by mouth at bedtime.  Dispense: 30 tablet; Refill: 1  5. History of eclampsia - Discussed why taking ASA (skipping doses) > hx of eclamptic seizure  Preterm labor symptoms and general obstetric precautions including but not limited to vaginal bleeding, contractions, leaking of fluid and fetal movement were reviewed in detail with the patient. Please refer to After Visit Summary for other counseling recommendations.  Return in about 2 weeks (around 02/04/2016).   Eino FarberWalidah Kennith GainN Karim, CNM

## 2016-01-22 LAB — GLUCOSE TOLERANCE, 3 HOURS
GLUCOSE 3 HOUR GTT: 106 mg/dL (ref <145)
GLUCOSE, 2 HOUR-GESTATIONAL: 177 mg/dL — AB (ref <165)
Glucose Tolerance, 1 hour: 206 mg/dL — ABNORMAL HIGH (ref <190)
Glucose Tolerance, Fasting: 92 mg/dL (ref 65–104)

## 2016-01-23 ENCOUNTER — Encounter: Payer: Self-pay | Admitting: Family

## 2016-01-23 DIAGNOSIS — O24419 Gestational diabetes mellitus in pregnancy, unspecified control: Secondary | ICD-10-CM | POA: Insufficient documentation

## 2016-01-25 ENCOUNTER — Telehealth: Payer: Self-pay

## 2016-01-25 ENCOUNTER — Ambulatory Visit: Payer: Medicaid Other | Admitting: *Deleted

## 2016-01-25 ENCOUNTER — Encounter: Payer: Medicaid Other | Attending: Obstetrics & Gynecology | Admitting: *Deleted

## 2016-01-25 DIAGNOSIS — O24419 Gestational diabetes mellitus in pregnancy, unspecified control: Secondary | ICD-10-CM

## 2016-01-25 DIAGNOSIS — Z029 Encounter for administrative examinations, unspecified: Secondary | ICD-10-CM | POA: Insufficient documentation

## 2016-01-25 MED ORDER — ACCU-CHEK AVIVA CONNECT W/DEVICE KIT: 1.0000 | PACK | Freq: Four times a day (QID) | Status: DC

## 2016-01-25 MED ORDER — ACCU-CHEK SOFTCLIX LANCET DEV MISC: Status: DC

## 2016-01-25 MED ORDER — GLUCOSE BLOOD VI STRP: ORAL_STRIP | Status: DC

## 2016-01-25 NOTE — Progress Notes (Signed)
  Patient was seen on 01/22/16 for Gestational Diabetes self-management . The following learning objectives were met by the patient :   States the definition of Gestational Diabetes  States why dietary management is important in controlling blood glucose  Describes the effects of carbohydrates on blood glucose levels  Demonstrates ability to create a balanced meal plan  Demonstrates carbohydrate counting   States when to check blood glucose levels  Demonstrates proper blood glucose monitoring techniques  States the effect of stress and exercise on blood glucose levels  States the importance of limiting caffeine and abstaining from alcohol and smoking  Plan:  Aim for 2 Carb Choices per meal (30 grams) +/- 1 either way for breakfast Aim for 3 Carb Choices per meal (45 grams) +/- 1 either way from lunch and dinner Aim for 1-2 Carbs per snack Begin reading food labels for Total Carbohydrate and sugar grams of foods Consider  increasing your activity level by walking daily as tolerated Begin checking BG before breakfast and 2 hours after first bit of breakfast, lunch and dinner after  as directed by MD  Take medication  as directed by MD  Blood glucose monitor given: Patient has Medicaid. Order sent to [facility], lancets and test strips.  Patient instructed to monitor glucose levels: FBS: 60 - <90 2 hour: <120  Patient received the following handouts:  Nutrition Diabetes and Pregnancy  Carbohydrate Counting List  Meal Planning worksheet  Patient will be seen for follow-up as needed.

## 2016-01-25 NOTE — Telephone Encounter (Signed)
Per Rochele PagesWalidah Karim, CNM pt failed 3 hr gtt and needs diabetic education.  Called pt and informed pt of results and the need for diabetes education.  Per Harriett SineNancy, pt can come today and she will try her best to fit her in but she needs to leave at 12noon.  I informed pt of Nancy's recommendations, pt stated that she will come in today to be seen.  Front office/Nancy informed of pt's appt.

## 2016-01-25 NOTE — Progress Notes (Signed)
Nutrition note: GDM diet education Pt was recently diagnosed with GDM. Pt has gained 19# @ 3156w2d, which is slightly > expected. Pt reports eating 3 meals & 2-3 snacks/d. Pt is taking a PNV. Pt reports having some heartburn & nausea. Pt reports no walking or physical activity outside of work. Pt received verbal & written education on GDM diet. Encouraged ~30 min of walking most days of the week. Discussed wt gain goals of 15-25# or 0.6#/wk. Pt agrees to follow GDM diet with 3 meals & 1-3 snacks/d with proper CHO/ protein combination. Pt does not have WIC but plans to apply. Pt is unsure about BF but wants to pump. F/u in 2-4 wks Blondell RevealLaura Kallie Depolo, MS, RD, LDN, [facility]

## 2016-01-28 ENCOUNTER — Ambulatory Visit (INDEPENDENT_AMBULATORY_CARE_PROVIDER_SITE_OTHER): Payer: Medicaid Other | Admitting: General Practice

## 2016-01-28 VITALS — BP 112/64 | HR 101 | Ht 65.0 in | Wt 199.0 lb

## 2016-01-28 DIAGNOSIS — O09213 Supervision of pregnancy with history of pre-term labor, third trimester: Secondary | ICD-10-CM

## 2016-01-30 ENCOUNTER — Inpatient Hospital Stay (HOSPITAL_COMMUNITY)
Admission: AD | Admit: 2016-01-30 | Discharge: 2016-01-30 | Disposition: A | Discharge status: Home / Self Care | Payer: Medicaid Other | Source: Ambulatory Visit | Attending: Obstetrics & Gynecology | Admitting: Obstetrics & Gynecology

## 2016-01-30 ENCOUNTER — Encounter (HOSPITAL_COMMUNITY): Payer: Self-pay | Admitting: *Deleted

## 2016-01-30 DIAGNOSIS — Z885 Allergy status to narcotic agent status: Secondary | ICD-10-CM | POA: Diagnosis not present

## 2016-01-30 DIAGNOSIS — O26893 Other specified pregnancy related conditions, third trimester: Secondary | ICD-10-CM | POA: Insufficient documentation

## 2016-01-30 DIAGNOSIS — J029 Acute pharyngitis, unspecified: Secondary | ICD-10-CM | POA: Diagnosis present

## 2016-01-30 DIAGNOSIS — O24410 Gestational diabetes mellitus in pregnancy, diet controlled: Secondary | ICD-10-CM | POA: Diagnosis not present

## 2016-01-30 DIAGNOSIS — Z3A31 31 weeks gestation of pregnancy: Secondary | ICD-10-CM | POA: Diagnosis not present

## 2016-01-30 DIAGNOSIS — R6889 Other general symptoms and signs: Secondary | ICD-10-CM | POA: Diagnosis not present

## 2016-01-30 DIAGNOSIS — O0993 Supervision of high risk pregnancy, unspecified, third trimester: Secondary | ICD-10-CM | POA: Diagnosis not present

## 2016-01-30 DIAGNOSIS — O4453 Low lying placenta with hemorrhage, third trimester: Secondary | ICD-10-CM | POA: Diagnosis not present

## 2016-01-30 DIAGNOSIS — J111 Influenza due to unidentified influenza virus with other respiratory manifestations: Secondary | ICD-10-CM | POA: Diagnosis not present

## 2016-01-30 DIAGNOSIS — Z7982 Long term (current) use of aspirin: Secondary | ICD-10-CM | POA: Insufficient documentation

## 2016-01-30 LAB — CBC
HCT: 32.3 % — ABNORMAL LOW (ref 36.0–46.0)
Hemoglobin: 10.8 g/dL — ABNORMAL LOW (ref 12.0–15.0)
MCH: 26 pg (ref 26.0–34.0)
MCHC: 33.4 g/dL (ref 30.0–36.0)
MCV: 77.6 fL — AB (ref 78.0–100.0)
PLATELETS: 185 10*3/uL (ref 150–400)
RBC: 4.16 MIL/uL (ref 3.87–5.11)
RDW: 14.5 % (ref 11.5–15.5)
WBC: 7.8 10*3/uL (ref 4.0–10.5)

## 2016-01-30 LAB — INFLUENZA PANEL BY PCR (TYPE A & B)
H1N1 flu by pcr: NOT DETECTED
INFLAPCR: NEGATIVE
Influenza B By PCR: POSITIVE — AB

## 2016-01-30 LAB — URINALYSIS, ROUTINE W REFLEX MICROSCOPIC
BILIRUBIN URINE: NEGATIVE
GLUCOSE, UA: NEGATIVE mg/dL
HGB URINE DIPSTICK: NEGATIVE
KETONES UR: NEGATIVE mg/dL
Leukocytes, UA: NEGATIVE
Nitrite: NEGATIVE
PH: 6 (ref 5.0–8.0)
Protein, ur: NEGATIVE mg/dL

## 2016-01-30 MED ORDER — LACTATED RINGERS IV BOLUS (SEPSIS)
1000.0000 mL | Freq: Once | INTRAVENOUS | Status: AC
  Administered 2016-01-30: 1000 mL via INTRAVENOUS

## 2016-01-30 MED ORDER — OXYCODONE-ACETAMINOPHEN 5-325 MG PO TABS
2.0000 | ORAL_TABLET | Freq: Once | ORAL | Status: AC
  Administered 2016-01-30: 2 via ORAL
  Filled 2016-01-30: qty 2

## 2016-01-30 MED ORDER — OSELTAMIVIR PHOSPHATE 75 MG PO CAPS: 75.0000 mg | ORAL_CAPSULE | Freq: Two times a day (BID) | ORAL | Status: DC

## 2016-01-30 NOTE — Discharge Instructions (Signed)
Pregnancy and Influenza °Influenza, also called the flu, is an infection of the respiratory tract. If you are pregnant, you are more likely to catch the flu. You are also more likely to have a more serious case of the flu. This is because pregnancy lowers your body's ability to fight off infections (it weakens your immune system). It also puts additional stress on your heart and lungs, which makes you more likely to have complications. Having a bad case of the flu, especially with a high fever, can be dangerous for your developing baby. It can cause you to go into early labor. °HOW DO PEOPLE GET THE FLU? °The flu is caused by the influenza virus. This virus is common every year in the fall and winter. It spreads when virus particles get passed from person to person. You can get the virus if you are near a sick person who is coughing or sneezing. You can also get the virus if you touch something that has the virus on it and then touch your face. °HOW CAN I PROTECT MYSELF AGAINST THE FLU? °· Get a flu shot. The best way to prevent the flu is to get a flu shot before flu season starts. The flu shot is not dangerous for your developing baby. It may even help protect your baby from the flu for up to 6 months after birth. The flu shot is one type of flu vaccine. Another type is a nasal spray vaccine. Do not get the nasal spray vaccine. It is not approved for pregnancy. °· Do not come in close contact with sick people. °· Do not share food, drinks, or utensils with other people. °· Wash your hands often. Use hand sanitizer when soap and water are not available. °WHAT SHOULD I DO IF I HAVE FLU SYMPTOMS? °If you have any flu symptoms, call your health care provider right away. Flu symptoms include: °· Fever or chills. °· Muscle aches. °· Headache. °· Sore throat. °· Nasal congestion. °· Cough. °· Feeling tired. °· Loss of appetite. °· Vomiting. °· Diarrhea. °You may be able to take an antiviral medicine to keep the flu from  becoming severe and to shorten how long it lasts. °WHAT SHOULD I DO AT HOME IF I AM DIAGNOSED WITH THE FLU? °· Do not take any medicine, including cold or flu medicine, unless directed by your health care provider. °· If you take antiviral medicine, make sure you finish it even if you start to feel better. °· Drink enough fluid to keep your urine clear or pale yellow. °· Get plenty of rest. °WHEN WOULD I SEEK IMMEDIATE MEDICAL CARE IF I HAVE THE FLU? °· You have trouble breathing. °· You have chest pain. °· You begin to have labor pains. °· You have a high fever that does not go down after you take medicine. °· You do not feel your baby move. °· You have diarrhea or vomiting that will not go away. °  °This information is not intended to replace advice given to you by your health care provider. Make sure you discuss any questions you have with your health care provider. °  °Document Released: 07/29/2008 Document Revised: 10/01/2013 Document Reviewed: 08/23/2013 °Elsevier Interactive Patient Education ©2016 Else[employer] Inc. ° °

## 2016-01-30 NOTE — MAU Note (Addendum)
Pt stated she has ben feeling sick since Thursday night. Has had body ache chills sore throat and headache and cough. Also c/o some abd cramping on and off as well.

## 2016-01-30 NOTE — MAU Provider Note (Signed)
Chief Complaint:  Influenza   None     HPI: Katrina Howard is a 35 y.o. [phone] at 43w0dwho presents to maternity admissions reporting onset 2 days ago of sore throat, cough, nasal congestion, body aches, and chills.  Her symptoms have worsened since onset. She has taken Benadryl and Robitussin, which help a little but do not relieve symptoms.  She reports her h/a as severe today, not relieved by Tylenol taken ~6 hours ago.  She reports exposure to illness since her friend had similar symptoms a few days ago.   She reports good fetal movement, denies regular cramping, LOF, vaginal bleeding, vaginal itching/burning, urinary symptoms, or n/v.  HPI  Past Medical History: History reviewed. No pertinent past medical history.  Past obstetric history: OB History  Gravida Para Term Preterm AB SAB TAB Ectopic Multiple Living  0 0 0 0 0 2    # Outcome Date GA Lbr Len/2nd Weight Sex Delivery Anes PTL Lv  4 Current           3 Term 11/22/10 [redacted]w[redacted]d  5 lb 13 oz (2.637 kg) M Vag-Spont EPI N Y     Complications: Eclampsia (toxemia of pregnancy)     Comments: seizures during labor  2 Preterm 12/24/06 102w0d  6 lb 15 oz (3.147 kg) M Vag-Spont EPI Y Y  1 Preterm 06/11/03 [redacted]w[redacted]d  1 lb (0.454 kg) M Vag-Spont Other Y FD     Complications: Abruptio Placenta      Past Surgical History: Past Surgical History  Procedure Laterality Date  . No past surgeries      Family History: Family History  Problem Relation Age of Onset  . Hypertension Mother   . Diabetes Father     Social History: Social History  Substance Use Topics  . Smoking status: Never Smoker   . Smokeless tobacco: Never Used  . Alcohol Use: No    Allergies:  Allergies  Allergen Reactions  . Morphine And Related Other (See Comments)    Reaction:  Hallucinations     Meds:  Facility-administered medications prior to admission  Medication Dose Route Frequency Provider Last Rate Last Dose  . hydroxyprogesterone caproate  (DELALUTIN) 250 mg/mL injection 250 mg  250 mg Intramuscular Weekly Willodean Rosenthal, MD   250 mg at 01/28/16 4540   Prescriptions prior to admission  Medication Sig Dispense Refill Last Dose  . acetaminophen (TYLENOL) 500 MG tablet Take 1,000 mg by mouth every 6 (six) hours as needed for mild pain or headache.    01/29/2016 at 1700  . aspirin EC 81 MG tablet Take 81 mg by mouth daily.   01/30/2016 at 0800  . Pediatric Multiple Vit-C-FA (FLINSTONES GUMMIES OMEGA-3 DHA) CHEW Chew 2 each by mouth daily.    01/30/2016 at Unknown time  . ranitidine (ZANTAC) 150 MG tablet Take 150 mg by mouth 2 (two) times daily as needed for heartburn.   Past Week at Unknown time  . cyclobenzaprine (FLEXERIL) 10 MG tablet Take 1 tablet (10 mg total) by mouth at bedtime. (Patient not taking: Reported on 01/30/2016) 30 tablet 1     ROS:  Review of Systems  Constitutional: Negative for fever, chills and fatigue.  HENT: Positive for congestion, postnasal drip, sinus pressure and sore throat.   Eyes: Negative for visual disturbance.  Respiratory: Negative for shortness of breath.   Cardiovascular: Negative for chest pain.  Gastrointestinal: Negative for nausea, vomiting and abdominal pain.  Genitourinary: Negative for dysuria, flank pain,  vaginal bleeding, vaginal discharge, difficulty urinating, vaginal pain and pelvic pain.  Neurological: Positive for headaches. Negative for dizziness.  Psychiatric/Behavioral: Negative.      I have reviewed patient's Past Medical Hx, Surgical Hx, Family Hx, Social Hx, medications and allergies.   Physical Exam  Patient Vitals for the past 24 hrs:  BP Temp Temp src Pulse Resp SpO2  01/30/16 1531 108/70 mmHg 99.9 F (37.7 C) Axillary 119 20 98 %   Constitutional: Well-developed, well-nourished female in no acute distress.  Cardiovascular: normal rate Respiratory: normal effort GI: Abd soft, non-tender, gravid appropriate for gestational age.  MS: Extremities nontender,  no edema, normal ROM Neurologic: Alert and oriented x 4.  GU: Neg CVAT.  PELVIC EXAM: Cervix pink, visually closed, without lesion, scant white creamy discharge, vaginal walls and external genitalia normal Bimanual exam: Cervix 0/long/high, firm, anterior, neg CMT, uterus nontender, nonenlarged, adnexa without tenderness, enlargement, or mass     FHT:  Baseline initially 170, down to 150 while in MAU , moderate variability, accelerations present, no decelerations Contractions: None on toco or to palpation   Labs:  AB/Positive/-- (12/17 0000)  Results for orders placed or performed during the hospital encounter of 01/30/16 (from the past 168 hour(s))  Urinalysis, Routine w reflex microscopic ([facility])   Collection Time: 01/30/16  3:15 PM  Result Value Ref Range   Color, Urine YELLOW YELLOW   APPearance CLEAR CLEAR   Specific Gravity, Urine <1.005 (L) 1.005 - 1.030   pH 6.0 5.0 - 8.0   Glucose, UA NEGATIVE NEGATIVE mg/dL   Hgb urine dipstick NEGATIVE NEGATIVE   Bilirubin Urine NEGATIVE NEGATIVE   Ketones, ur NEGATIVE NEGATIVE mg/dL   Protein, ur NEGATIVE NEGATIVE mg/dL   Nitrite NEGATIVE NEGATIVE   Leukocytes, UA NEGATIVE NEGATIVE  CBC   Collection Time: 01/30/16  3:50 PM  Result Value Ref Range   WBC 7.8 4.0 - 10.5 K/uL   RBC 4.16 3.87 - 5.11 MIL/uL   Hemoglobin 10.8 (L) 12.0 - 15.0 g/dL   HCT 40.932.3 (L) 81.136.0 - 91.446.0 %   MCV 77.6 (L) 78.0 - 100.0 fL   MCH 26.0 26.0 - 34.0 pg   MCHC 33.4 30.0 - 36.0 g/dL   RDW 78.214.5 95.611.5 - 21.315.5 %   Platelets 185 150 - 400 K/uL  Influenza panel by PCR (type A & B, H1N1)   Collection Time: 01/30/16  3:50 PM  Result Value Ref Range   Influenza A By PCR NEGATIVE NEGATIVE   Influenza B By PCR POSITIVE (A) NEGATIVE   H1N1 flu by pcr NOT DETECTED NOT DETECTED    Imaging:  [place] Ob Follow Up  01/07/2016  OBSTETRICAL ULTRASOUND: This exam was performed within a  Ultrasound Department. The OB US report was generated in the AS  system, and faxed to the ordering physician.  This report is available in the [facility] [employer] PACS. See the AS Obstetric US report via the Image Link.   MAU Course/MDM: I have ordered labs and reviewed results.  Will treat for influenza with Tamiflu while labs pending. Tamiflu BID x 5 days.  F/U with Urgent Care or ED if symptoms persist or worsen. F/U with prenatal care as scheduled.  Treatments in MAU included LR x 2000 ml, Percocet 5/325 x 2 tabs for h/a and fever.  Pt stable at time of discharge.  Assessment: 1. Diet controlled gestational diabetes mellitus (GDM), antepartum   2. Low lying placenta, antepartum   3. Supervision of high-risk  pregnancy, third trimester   4. History of preterm delivery, currently pregnant, third trimester   5. History of eclampsia   6. Vaginal bleeding during pregnancy, antepartum     Plan: Discharge home Labor precautions and fetal kick counts    Medication List    ASK your doctor about these medications        acetaminophen 500 MG tablet  Commonly known as:  TYLENOL  Take 1,000 mg by mouth every 6 (six) hours as needed for mild pain or headache.     aspirin EC 81 MG tablet  Take 81 mg by mouth daily.     cyclobenzaprine 10 MG tablet  Commonly known as:  FLEXERIL  Take 1 tablet (10 mg total) by mouth at bedtime.     FLINSTONES GUMMIES OMEGA-3 DHA Chew  Chew 2 each by mouth daily.     ranitidine 150 MG tablet  Commonly known as:  ZANTAC  Take 150 mg by mouth 2 (two) times daily as needed for heartburn.        Sharen Counter Certified Nurse-Midwife 01/30/2016 5:29 PM

## 2016-02-04 ENCOUNTER — Encounter: Payer: Self-pay | Admitting: Obstetrics & Gynecology

## 2016-02-04 ENCOUNTER — Ambulatory Visit (INDEPENDENT_AMBULATORY_CARE_PROVIDER_SITE_OTHER): Payer: Medicaid Other | Admitting: Obstetrics & Gynecology

## 2016-02-04 VITALS — BP 114/62 | HR 90 | Wt 196.1 lb

## 2016-02-04 DIAGNOSIS — O09213 Supervision of pregnancy with history of pre-term labor, third trimester: Secondary | ICD-10-CM | POA: Diagnosis not present

## 2016-02-04 DIAGNOSIS — J101 Influenza due to other identified influenza virus with other respiratory manifestations: Secondary | ICD-10-CM | POA: Diagnosis not present

## 2016-02-04 DIAGNOSIS — Z23 Encounter for immunization: Secondary | ICD-10-CM

## 2016-02-04 DIAGNOSIS — B349 Viral infection, unspecified: Secondary | ICD-10-CM | POA: Insufficient documentation

## 2016-02-04 DIAGNOSIS — O0993 Supervision of high risk pregnancy, unspecified, third trimester: Secondary | ICD-10-CM

## 2016-02-04 DIAGNOSIS — O98519 Other viral diseases complicating pregnancy, unspecified trimester: Secondary | ICD-10-CM | POA: Insufficient documentation

## 2016-02-04 DIAGNOSIS — O24410 Gestational diabetes mellitus in pregnancy, diet controlled: Secondary | ICD-10-CM | POA: Diagnosis not present

## 2016-02-04 DIAGNOSIS — O98513 Other viral diseases complicating pregnancy, third trimester: Secondary | ICD-10-CM | POA: Diagnosis not present

## 2016-02-04 DIAGNOSIS — O09893 Supervision of other high risk pregnancies, third trimester: Secondary | ICD-10-CM

## 2016-02-04 LAB — POCT URINALYSIS DIP (DEVICE)
BILIRUBIN URINE: NEGATIVE
Glucose, UA: NEGATIVE mg/dL
HGB URINE DIPSTICK: NEGATIVE
KETONES UR: NEGATIVE mg/dL
LEUKOCYTES UA: NEGATIVE
NITRITE: NEGATIVE
PH: 7 (ref 5.0–8.0)
Protein, ur: 30 mg/dL — AB
Specific Gravity, Urine: 1.02 (ref 1.005–1.030)
Urobilinogen, UA: 2 mg/dL — ABNORMAL HIGH (ref 0.0–1.0)

## 2016-02-04 NOTE — Patient Instructions (Signed)
Return to clinic for any scheduled appointments or obstetric concerns, or go to MAU for evaluation  

## 2016-02-04 NOTE — Progress Notes (Signed)
Subjective:  Katrina Howard is a 35 y.o. [phone] at 3572w5d being seen today for ongoing prenatal care.  She is currently monitored for the following issues for this high-risk pregnancy and has Supervision of high-risk pregnancy; History of preterm delivery, currently pregnant; History of eclampsia; Migraine; Low lying placenta, antepartum; History of placenta abruption; GERD (gastroesophageal reflux disease); Gestational diabetes mellitus (GDM), antepartum; Influenza B; and Influenza B infection in pregnancy on her problem list.  Patient reports getting over flu; was recently diagnosed with Influenza B. On last day of Tamiflu.   Contractions: Irritability. Vag. Bleeding: None.  Movement: Present. Denies leaking of fluid.   The following portions of the patient's history were reviewed and updated as appropriate: allergies, current medications, past family history, past medical history, past social history, past surgical history and problem list. Problem list updated.  Objective:   Filed Vitals:   02/04/16 0907  BP: 114/62  Pulse: 90  Weight: 196 lb 1.6 oz (88.95 kg)    Fetal Status: Fetal Heart Rate (bpm): 132 Fundal Height: 32 cm Movement: Present     General:  Alert, oriented and cooperative. Patient is in no acute distress.  Skin: Skin is warm and dry. No rash noted.   Cardiovascular: Normal heart rate noted  Respiratory: Normal respiratory effort, no problems with respiration noted  Abdomen: Soft, gravid, appropriate for gestational age. Pain/Pressure: Present     Pelvic: Vag. Bleeding: None   Cervical exam deferred        Extremities: Normal range of motion.  Edema: Trace  Mental Status: Normal mood and affect. Normal behavior. Normal judgment and thought content.   Urinalysis: Urine Protein: 1+ Urine Glucose: Negative No CBG log today, had flu  Assessment and Plan:  Pregnancy: [id number] at 8772w5d  1. Influenza B 2. Viral infection in pregnancy, third trimester Positive culture on  01/30/16; completed course of Tamiflu  3. Need for Tdap vaccination - Tdap vaccine greater than or equal to 7yo IM; Standing - Tdap vaccine greater than or equal to 7yo IM  4. History of preterm delivery, currently pregnant, third trimester Continue weekly 17P.  5. Diet controlled gestational diabetes mellitus (GDM), antepartum Forgot sugar log, will reevaluate next visit  6. Supervision of high-risk pregnancy, third trimester Preterm labor symptoms and general obstetric precautions including but not limited to vaginal bleeding, contractions, leaking of fluid and fetal movement were reviewed in detail with the patient. Please refer to After Visit Summary for other counseling recommendations.  Return in about 2 weeks (around 02/18/2016) for OB Visit.   Tereso NewcomerUgonna A Ritchie Klee, MD

## 2016-02-04 NOTE — Progress Notes (Signed)
States she is getting over the flu, would like to get the tdap next visit.

## 2016-02-11 ENCOUNTER — Ambulatory Visit (INDEPENDENT_AMBULATORY_CARE_PROVIDER_SITE_OTHER): Payer: Medicaid Other | Admitting: *Deleted

## 2016-02-11 DIAGNOSIS — O09213 Supervision of pregnancy with history of pre-term labor, third trimester: Secondary | ICD-10-CM

## 2016-02-11 DIAGNOSIS — O09893 Supervision of other high risk pregnancies, third trimester: Secondary | ICD-10-CM

## 2016-02-18 ENCOUNTER — Ambulatory Visit (INDEPENDENT_AMBULATORY_CARE_PROVIDER_SITE_OTHER): Payer: Medicaid Other | Admitting: Family

## 2016-02-18 ENCOUNTER — Ambulatory Visit (HOSPITAL_COMMUNITY)
Admission: RE | Admit: 2016-02-18 | Discharge: 2016-02-18 | Disposition: A | Discharge status: Home / Self Care | Payer: Medicaid Other | Source: Ambulatory Visit | Attending: Obstetrics & Gynecology | Admitting: Obstetrics & Gynecology

## 2016-02-18 ENCOUNTER — Encounter (HOSPITAL_COMMUNITY): Payer: Self-pay

## 2016-02-18 ENCOUNTER — Encounter: Payer: Self-pay | Admitting: *Deleted

## 2016-02-18 VITALS — BP 119/66 | HR 95 | Wt 199.0 lb

## 2016-02-18 DIAGNOSIS — O4443 Low lying placenta NOS or without hemorrhage, third trimester: Secondary | ICD-10-CM | POA: Diagnosis not present

## 2016-02-18 DIAGNOSIS — Z3A33 33 weeks gestation of pregnancy: Secondary | ICD-10-CM | POA: Insufficient documentation

## 2016-02-18 DIAGNOSIS — O24410 Gestational diabetes mellitus in pregnancy, diet controlled: Secondary | ICD-10-CM | POA: Insufficient documentation

## 2016-02-18 DIAGNOSIS — O4400 Placenta previa specified as without hemorrhage, unspecified trimester: Secondary | ICD-10-CM

## 2016-02-18 DIAGNOSIS — O09213 Supervision of pregnancy with history of pre-term labor, third trimester: Secondary | ICD-10-CM

## 2016-02-18 DIAGNOSIS — O0993 Supervision of high risk pregnancy, unspecified, third trimester: Secondary | ICD-10-CM

## 2016-02-18 DIAGNOSIS — O09893 Supervision of other high risk pregnancies, third trimester: Secondary | ICD-10-CM

## 2016-02-18 DIAGNOSIS — O4440 Low lying placenta NOS or without hemorrhage, unspecified trimester: Secondary | ICD-10-CM

## 2016-02-18 DIAGNOSIS — O24419 Gestational diabetes mellitus in pregnancy, unspecified control: Secondary | ICD-10-CM

## 2016-02-18 LAB — POCT URINALYSIS DIP (DEVICE)
BILIRUBIN URINE: NEGATIVE
Glucose, UA: NEGATIVE mg/dL
HGB URINE DIPSTICK: NEGATIVE
LEUKOCYTES UA: NEGATIVE
Nitrite: NEGATIVE
Protein, ur: 100 mg/dL — AB
SPECIFIC GRAVITY, URINE: 1.02 (ref 1.005–1.030)
Urobilinogen, UA: 1 mg/dL (ref 0.0–1.0)
pH: 6.5 (ref 5.0–8.0)

## 2016-02-18 LAB — GLUCOSE, CAPILLARY: Glucose-Capillary: 139 mg/dL — ABNORMAL HIGH (ref 65–99)

## 2016-02-18 MED ORDER — GLYBURIDE 2.5 MG PO TABS: 2.5000 mg | ORAL_TABLET | Freq: Two times a day (BID) | ORAL | Status: DC

## 2016-02-18 NOTE — Progress Notes (Signed)
Tubal consents signed today. Copy given to patient. BPP scheduled 05/18 @ 1030am

## 2016-02-18 NOTE — Progress Notes (Signed)
Subjective:  Katrina Howard is a 35 y.o. [phone] at 7120w5d being seen today for ongoing prenatal care.  She is currently monitored for the following issues for this high-risk pregnancy and has Supervision of high-risk pregnancy; History of preterm delivery, currently pregnant; History of eclampsia; Migraine; Low lying placenta, antepartum; History of placenta abruption; GERD (gastroesophageal reflux disease); Gestational diabetes mellitus (GDM), antepartum; Influenza B; and Influenza B infection in pregnancy on her problem list.  Patient reports irregular contractions.  Contractions: Not present. Vag. Bleeding: None.  Movement: Present. Denies leaking of fluid.   The following portions of the patient's history were reviewed and updated as appropriate: allergies, current medications, past family history, past medical history, past social history, past surgical history and problem list. Problem list updated.  Objective:   Filed Vitals:   02/18/16 0905  BP: 119/66  Pulse: 95  Weight: 199 lb (90.266 kg)    Fetal Status: Fetal Heart Rate (bpm): 141 Fundal Height: 34 cm Movement: Present  Presentation: Vertex  General:  Alert, oriented and cooperative. Patient is in no acute distress.  Skin: Skin is warm and dry. No rash noted.   Cardiovascular: Normal heart rate noted  Respiratory: Normal respiratory effort, no problems with respiration noted  Abdomen: Soft, gravid, appropriate for gestational age. Pain/Pressure: Present     Pelvic: Vag. Bleeding: None     Cervical exam performed Dilation: Closed      Extremities: Normal range of motion.  Edema: Trace  Mental Status: Normal mood and affect. Normal behavior. Normal judgment and thought content.   Urinalysis: Urine Protein: 2+ Urine Glucose: Negative  Pt reports not having CBG log; ate two hours ago, reports all CBG's nml; after getting CBG of 139, pt admits that that's what they typically run  Assessment and Plan:  Pregnancy: [id number] at  3220w5d  1. Supervision of high-risk pregnancy, third trimester - POCT CBG (Fasting - Glucose)  2. Low lying placenta, antepartum - Follow-up ultrasound today  3. History of preterm delivery, currently pregnant, third trimester - 17-p today  4. Gestational diabetes mellitus (GDM), antepartum - CBG today > begin fetal testing - Explained the importance of bringing log so that we can assess if CBG are wnl; may need fetal testing if abnormal, risk of stillbirth - Begin Glyburide 2.5 mg BID - BPP today, repeat next week since clinic closed  Preterm labor symptoms and general obstetric precautions including but not limited to vaginal bleeding, contractions, leaking of fluid and fetal movement were reviewed in detail with the patient. Please refer to After Visit Summary for other counseling recommendations.  Return in about 2 weeks (around 03/03/2016) for 17p weekly.   Eino FarberWalidah Kennith GainN Karim, CNM

## 2016-02-18 NOTE — Progress Notes (Signed)
17 P given today  Decline TDAP at this time

## 2016-02-25 ENCOUNTER — Ambulatory Visit (HOSPITAL_COMMUNITY)
Admission: RE | Admit: 2016-02-25 | Discharge: 2016-02-25 | Disposition: A | Discharge status: Home / Self Care | Payer: Medicaid Other | Source: Ambulatory Visit | Attending: Family | Admitting: Family

## 2016-02-25 ENCOUNTER — Encounter (HOSPITAL_COMMUNITY): Payer: Self-pay

## 2016-02-25 ENCOUNTER — Other Ambulatory Visit: Payer: Self-pay | Admitting: Family

## 2016-02-25 DIAGNOSIS — O09293 Supervision of pregnancy with other poor reproductive or obstetric history, third trimester: Secondary | ICD-10-CM

## 2016-02-25 DIAGNOSIS — O09213 Supervision of pregnancy with history of pre-term labor, third trimester: Secondary | ICD-10-CM | POA: Diagnosis not present

## 2016-02-25 DIAGNOSIS — Z3A34 34 weeks gestation of pregnancy: Secondary | ICD-10-CM

## 2016-02-25 DIAGNOSIS — O24419 Gestational diabetes mellitus in pregnancy, unspecified control: Secondary | ICD-10-CM

## 2016-02-25 DIAGNOSIS — O24415 Gestational diabetes mellitus in pregnancy, controlled by oral hypoglycemic drugs: Secondary | ICD-10-CM | POA: Insufficient documentation

## 2016-02-26 ENCOUNTER — Ambulatory Visit (INDEPENDENT_AMBULATORY_CARE_PROVIDER_SITE_OTHER): Payer: Medicaid Other | Admitting: General Practice

## 2016-02-26 VITALS — BP 112/55 | HR 89 | Ht 65.0 in | Wt 201.0 lb

## 2016-02-26 DIAGNOSIS — O09213 Supervision of pregnancy with history of pre-term labor, third trimester: Secondary | ICD-10-CM | POA: Diagnosis present

## 2016-02-26 DIAGNOSIS — O09893 Supervision of other high risk pregnancies, third trimester: Secondary | ICD-10-CM

## 2016-03-03 ENCOUNTER — Encounter: Payer: Self-pay | Admitting: Obstetrics and Gynecology

## 2016-03-03 ENCOUNTER — Encounter: Payer: Medicaid Other | Admitting: Obstetrics and Gynecology

## 2016-03-03 ENCOUNTER — Ambulatory Visit (INDEPENDENT_AMBULATORY_CARE_PROVIDER_SITE_OTHER): Payer: Medicaid Other | Admitting: Obstetrics and Gynecology

## 2016-03-03 VITALS — BP 111/68 | HR 101 | Wt 200.3 lb

## 2016-03-03 DIAGNOSIS — Z8759 Personal history of other complications of pregnancy, childbirth and the puerperium: Secondary | ICD-10-CM | POA: Diagnosis not present

## 2016-03-03 DIAGNOSIS — O24415 Gestational diabetes mellitus in pregnancy, controlled by oral hypoglycemic drugs: Secondary | ICD-10-CM

## 2016-03-03 DIAGNOSIS — O09893 Supervision of other high risk pregnancies, third trimester: Secondary | ICD-10-CM

## 2016-03-03 DIAGNOSIS — O09213 Supervision of pregnancy with history of pre-term labor, third trimester: Secondary | ICD-10-CM

## 2016-03-03 DIAGNOSIS — Z36 Encounter for antenatal screening of mother: Secondary | ICD-10-CM

## 2016-03-03 LAB — POCT URINALYSIS DIP (DEVICE)
BILIRUBIN URINE: NEGATIVE
Glucose, UA: NEGATIVE mg/dL
HGB URINE DIPSTICK: NEGATIVE
KETONES UR: NEGATIVE mg/dL
LEUKOCYTES UA: NEGATIVE
NITRITE: NEGATIVE
Protein, ur: 30 mg/dL — AB
Specific Gravity, Urine: 1.02 (ref 1.005–1.030)
Urobilinogen, UA: 1 mg/dL (ref 0.0–1.0)
pH: 6.5 (ref 5.0–8.0)

## 2016-03-03 NOTE — Progress Notes (Signed)
Pt reports increased UC's today

## 2016-03-03 NOTE — Progress Notes (Signed)
Subjective:  Katrina Howard is a 35 y.o. [phone] at 2665w5d being seen today for ongoing prenatal care.  She is currently monitored for the following issues for this high-risk pregnancy and has Supervision of high-risk pregnancy; History of preterm delivery, currently pregnant; History of eclampsia; Migraine; Low lying placenta, antepartum; History of placenta abruption; GERD (gastroesophageal reflux disease); Gestational diabetes mellitus (GDM), antepartum; Influenza B; and Influenza B infection in pregnancy on her problem list.  Patient reports no complaints.  Contractions: Irregular. Vag. Bleeding: None.  Movement: Present. Denies leaking of fluid.   The following portions of the patient's history were reviewed and updated as appropriate: allergies, current medications, past family history, past medical history, past social history, past surgical history and problem list. Problem list updated.  Objective:   Filed Vitals:   03/03/16 0946  BP: 111/68  Pulse: 101  Weight: 200 lb 4.8 oz (90.855 kg)    Fetal Status: Fetal Heart Rate (bpm): NST   Movement: Present     General:  Alert, oriented and cooperative. Patient is in no acute distress.  Skin: Skin is warm and dry. No rash noted.   Cardiovascular: Normal heart rate noted  Respiratory: Normal respiratory effort, no problems with respiration noted  Abdomen: Soft, gravid, appropriate for gestational age. Pain/Pressure: Present     Pelvic: Vag. Bleeding: None     Cervical exam deferred        Extremities: Normal range of motion.  Edema: None  Mental Status: Normal mood and affect. Normal behavior. Normal judgment and thought content.   Urinalysis: Urine Protein: 1+ Urine Glucose: Negative  Assessment and Plan:  Pregnancy: [id number] at 7265w5d  1. Gestational diabetes mellitus (GDM) controlled on oral hypoglycemic drug, antepartum CBGs reviewed and majority within range. Highest pp 153. Patient admits to overeating and feeling hungry. She  is not always consuming a snack Diet reviewed with the patient Continue glyburide 2.5 mg BID Follow up growth ultrasound on 6/8 - Amniotic fluid index with NST- reviewed and reactive - US MFM OB FOLLOW UP; Future  2. History of eclampsia SYmptoms reviewed with the patient  3. History of preterm delivery, currently pregnant, third trimester Continue weekly 17-P    Preterm labor symptoms and general obstetric precautions including but not limited to vaginal bleeding, contractions, leaking of fluid and fetal movement were reviewed in detail with the patient. Please refer to After Visit Summary for other counseling recommendations.  Return in about 5 days (around 03/08/2016) for NST @ 1430 (overbook).   Catalina AntiguaPeggy Meaghan Whistler, MD

## 2016-03-08 ENCOUNTER — Ambulatory Visit (INDEPENDENT_AMBULATORY_CARE_PROVIDER_SITE_OTHER): Payer: Medicaid Other | Admitting: *Deleted

## 2016-03-08 DIAGNOSIS — O24419 Gestational diabetes mellitus in pregnancy, unspecified control: Secondary | ICD-10-CM | POA: Diagnosis not present

## 2016-03-08 NOTE — Progress Notes (Signed)
5/30 NST reviewed and reactive 

## 2016-03-10 ENCOUNTER — Other Ambulatory Visit (HOSPITAL_COMMUNITY)
Admission: RE | Admit: 2016-03-10 | Discharge: 2016-03-10 | Disposition: A | Discharge status: Home / Self Care | Payer: Medicaid Other | Source: Ambulatory Visit | Attending: Obstetrics and Gynecology | Admitting: Obstetrics and Gynecology

## 2016-03-10 ENCOUNTER — Ambulatory Visit (INDEPENDENT_AMBULATORY_CARE_PROVIDER_SITE_OTHER): Payer: Medicaid Other | Admitting: Obstetrics and Gynecology

## 2016-03-10 ENCOUNTER — Encounter: Payer: Self-pay | Admitting: Obstetrics and Gynecology

## 2016-03-10 VITALS — BP 103/68 | HR 93 | Wt 204.5 lb

## 2016-03-10 DIAGNOSIS — Z113 Encounter for screening for infections with a predominantly sexual mode of transmission: Secondary | ICD-10-CM

## 2016-03-10 DIAGNOSIS — Z8759 Personal history of other complications of pregnancy, childbirth and the puerperium: Secondary | ICD-10-CM

## 2016-03-10 DIAGNOSIS — O09893 Supervision of other high risk pregnancies, third trimester: Secondary | ICD-10-CM

## 2016-03-10 DIAGNOSIS — O24415 Gestational diabetes mellitus in pregnancy, controlled by oral hypoglycemic drugs: Secondary | ICD-10-CM | POA: Diagnosis present

## 2016-03-10 DIAGNOSIS — O09213 Supervision of pregnancy with history of pre-term labor, third trimester: Secondary | ICD-10-CM | POA: Diagnosis not present

## 2016-03-10 DIAGNOSIS — Z36 Encounter for antenatal screening of mother: Secondary | ICD-10-CM

## 2016-03-10 DIAGNOSIS — O0993 Supervision of high risk pregnancy, unspecified, third trimester: Secondary | ICD-10-CM

## 2016-03-10 LAB — POCT URINALYSIS DIP (DEVICE)
Bilirubin Urine: NEGATIVE
GLUCOSE, UA: NEGATIVE mg/dL
Hgb urine dipstick: NEGATIVE
KETONES UR: NEGATIVE mg/dL
LEUKOCYTES UA: NEGATIVE
Nitrite: NEGATIVE
Protein, ur: 30 mg/dL — AB
SPECIFIC GRAVITY, URINE: 1.015 (ref 1.005–1.030)
UROBILINOGEN UA: 0.2 mg/dL (ref 0.0–1.0)
pH: 6.5 (ref 5.0–8.0)

## 2016-03-10 LAB — OB RESULTS CONSOLE GBS: GBS: POSITIVE

## 2016-03-10 NOTE — Progress Notes (Signed)
Subjective:  Sallyanne KusterKasita Grosvenor is a 35 y.o. [phone] at 4467w5d being seen today for ongoing prenatal care.  She is currently monitored for the following issues for this high-risk pregnancy and has Supervision of high-risk pregnancy; History of preterm delivery, currently pregnant; History of eclampsia; Migraine; Low lying placenta, antepartum; History of placenta abruption; GERD (gastroesophageal reflux disease); Gestational diabetes mellitus (GDM), antepartum; Influenza B; and Influenza B infection in pregnancy on her problem list.  Patient reports no complaints.  Contractions: Irregular. Vag. Bleeding: None.  Movement: Present. Denies leaking of fluid.   The following portions of the patient's history were reviewed and updated as appropriate: allergies, current medications, past family history, past medical history, past social history, past surgical history and problem list. Problem list updated.  Objective:   Filed Vitals:   03/10/16 0935  BP: 103/68  Pulse: 93  Weight: 204 lb 8 oz (92.761 kg)    Fetal Status: Fetal Heart Rate (bpm): NST Fundal Height: 38 cm Movement: Present  Presentation: Vertex  General:  Alert, oriented and cooperative. Patient is in no acute distress.  Skin: Skin is warm and dry. No rash noted.   Cardiovascular: Normal heart rate noted  Respiratory: Normal respiratory effort, no problems with respiration noted  Abdomen: Soft, gravid, appropriate for gestational age. Pain/Pressure: Present     Pelvic: Vag. Bleeding: None     Cervical exam performed Dilation: Closed Effacement (%): Thick Station: Ballotable  Extremities: Normal range of motion.  Edema: None  Mental Status: Normal mood and affect. Normal behavior. Normal judgment and thought content.   Urinalysis:      Assessment and Plan:  Pregnancy: [id number] at 3167w5d  1. Gestational diabetes mellitus (GDM) controlled on oral hypoglycemic drug, antepartum CBGs reviewed and fasting values are 92, 100, and 118. PP  are in the 140's.  Discussed increasing glyburide dosage but patient declines. She admits to overeating and feeling hungry and not consuming appropriate snacks. She states that she is tired of taking medications but will continue for the remaining 2 weeks of this pregnancy and will try to make an effort with her diet Follow up growth scan on 03/17/16  - Amniotic fluid index with NST- reviewed and reactive - Culture, beta strep (group b only) - GC/Chlamydia probe amp ([facility]  2. History of eclampsia   3. History of preterm delivery, currently pregnant, third trimester Was receiving weekly 17-P  4. Supervision of high-risk pregnancy, third trimester Cultures collected today  5. History of placenta abruption resolved  Preterm labor symptoms and general obstetric precautions including but not limited to vaginal bleeding, contractions, leaking of fluid and fetal movement were reviewed in detail with the patient. Please refer to After Visit Summary for other counseling recommendations.  Return in about 4 days (around 03/14/2016) for 2x/wk as scheduled.   Catalina AntiguaPeggy Brant Peets, MD

## 2016-03-10 NOTE — Progress Notes (Signed)
Pt requests a letter for Social Services stating that she is on [employer].   US for growth scheduled 6/8

## 2016-03-11 LAB — GC/CHLAMYDIA PROBE AMP (~~LOC~~) NOT AT ARMC
CHLAMYDIA, DNA PROBE: NEGATIVE
NEISSERIA GONORRHEA: NEGATIVE

## 2016-03-12 LAB — CULTURE, BETA STREP (GROUP B ONLY)

## 2016-03-14 ENCOUNTER — Ambulatory Visit (INDEPENDENT_AMBULATORY_CARE_PROVIDER_SITE_OTHER): Payer: Medicaid Other | Admitting: *Deleted

## 2016-03-14 VITALS — BP 110/69 | HR 99

## 2016-03-14 DIAGNOSIS — O24415 Gestational diabetes mellitus in pregnancy, controlled by oral hypoglycemic drugs: Secondary | ICD-10-CM | POA: Diagnosis not present

## 2016-03-17 ENCOUNTER — Other Ambulatory Visit: Payer: Medicaid Other

## 2016-03-17 ENCOUNTER — Encounter (HOSPITAL_COMMUNITY): Payer: Self-pay

## 2016-03-17 ENCOUNTER — Ambulatory Visit (INDEPENDENT_AMBULATORY_CARE_PROVIDER_SITE_OTHER): Payer: Medicaid Other | Admitting: Family Medicine

## 2016-03-17 ENCOUNTER — Encounter (HOSPITAL_COMMUNITY): Payer: Self-pay | Admitting: *Deleted

## 2016-03-17 ENCOUNTER — Ambulatory Visit (HOSPITAL_COMMUNITY)
Admission: RE | Admit: 2016-03-17 | Discharge: 2016-03-17 | Disposition: A | Discharge status: Home / Self Care | Payer: Medicaid Other | Source: Ambulatory Visit | Attending: Obstetrics and Gynecology | Admitting: Obstetrics and Gynecology

## 2016-03-17 ENCOUNTER — Telehealth (HOSPITAL_COMMUNITY): Payer: Self-pay | Admitting: *Deleted

## 2016-03-17 VITALS — BP 115/67 | HR 85 | Wt 210.1 lb

## 2016-03-17 DIAGNOSIS — Z3A37 37 weeks gestation of pregnancy: Secondary | ICD-10-CM | POA: Insufficient documentation

## 2016-03-17 DIAGNOSIS — O4440 Low lying placenta NOS or without hemorrhage, unspecified trimester: Secondary | ICD-10-CM

## 2016-03-17 DIAGNOSIS — O09213 Supervision of pregnancy with history of pre-term labor, third trimester: Secondary | ICD-10-CM

## 2016-03-17 DIAGNOSIS — O09893 Supervision of other high risk pregnancies, third trimester: Secondary | ICD-10-CM

## 2016-03-17 DIAGNOSIS — O4443 Low lying placenta NOS or without hemorrhage, third trimester: Secondary | ICD-10-CM

## 2016-03-17 DIAGNOSIS — O0992 Supervision of high risk pregnancy, unspecified, second trimester: Secondary | ICD-10-CM

## 2016-03-17 DIAGNOSIS — O09293 Supervision of pregnancy with other poor reproductive or obstetric history, third trimester: Secondary | ICD-10-CM | POA: Insufficient documentation

## 2016-03-17 DIAGNOSIS — Z8759 Personal history of other complications of pregnancy, childbirth and the puerperium: Secondary | ICD-10-CM | POA: Diagnosis not present

## 2016-03-17 DIAGNOSIS — O24415 Gestational diabetes mellitus in pregnancy, controlled by oral hypoglycemic drugs: Secondary | ICD-10-CM | POA: Diagnosis present

## 2016-03-17 LAB — POCT URINALYSIS DIP (DEVICE)
BILIRUBIN URINE: NEGATIVE
GLUCOSE, UA: NEGATIVE mg/dL
Hgb urine dipstick: NEGATIVE
KETONES UR: NEGATIVE mg/dL
LEUKOCYTES UA: NEGATIVE
Nitrite: NEGATIVE
Protein, ur: 30 mg/dL — AB
SPECIFIC GRAVITY, URINE: 1.02 (ref 1.005–1.030)
Urobilinogen, UA: 0.2 mg/dL (ref 0.0–1.0)
pH: 6.5 (ref 5.0–8.0)

## 2016-03-17 LAB — GLUCOSE, CAPILLARY: GLUCOSE-CAPILLARY: 108 mg/dL — AB (ref 65–99)

## 2016-03-17 NOTE — Progress Notes (Signed)
US for growth and BPP done today 

## 2016-03-17 NOTE — Telephone Encounter (Signed)
Preadmission screen  

## 2016-03-17 NOTE — Patient Instructions (Addendum)
You will have a scheduled induction of labor at 39 weeks because of your gestational diabetes.   You need to call your pharmacy and ask to have more testing strips  You will take 2 tablets of your glyburide if you know you are going to have an unhealthy meal.    Come to the MAU (maternity admission unit) for 1) Strong contractions every 2-3 minutes for at least 1 hour that do not go away when you drink water or take a warm shower. These contractions will be so strong all you can do is breath through them 2) Vaginal bleeding- anything more than spotting 3) Loss of fluid like you broke your water 4) Decreased movement of your baby      Labor Induction Labor induction is when steps are taken to cause a pregnant woman to begin the labor process. Most women go into labor on their own between 37 weeks and 42 weeks of the pregnancy. When this does not happen or when there is a medical need, methods may be used to induce labor. Labor induction causes a pregnant woman's uterus to contract. It also causes the cervix to soften (ripen), open (dilate), and thin out (efface). Usually, labor is not induced before 39 weeks of the pregnancy unless there is a problem with the baby or mother.  Before inducing labor, your health care provider will consider a number of factors, including the following:  The medical condition of you and the baby.   How many weeks along you are.   The status of the baby's lung maturity.   The condition of the cervix.   The position of the baby.  WHAT ARE THE REASONS FOR LABOR INDUCTION? Labor may be induced for the following reasons:  The health of the baby or mother is at risk.   The pregnancy is overdue by 1 week or more.   The water breaks but labor does not start on its own.   The mother has a health condition or serious illness, such as high blood pressure, infection, placental abruption, or diabetes.  The amniotic fluid amounts are low around the baby.    The baby is distressed.  Convenience or wanting the baby to be born on a certain date is not a reason for inducing labor. WHAT METHODS ARE USED FOR LABOR INDUCTION? Several methods of labor induction may be used, such as:   Prostaglandin medicine. This medicine causes the cervix to dilate and ripen. The medicine will also start contractions. It can be taken by mouth or by inserting a suppository into the vagina.   Inserting a thin tube (catheter) with a balloon on the end into the vagina to dilate the cervix. Once inserted, the balloon is expanded with water, which causes the cervix to open.   Stripping the membranes. Your health care provider separates amniotic sac tissue from the cervix, causing the cervix to be stretched and causing the release of a hormone called progesterone. This may cause the uterus to contract. It is often done during an office visit. You will be sent home to wait for the contractions to begin. You will then come in for an induction.   Breaking the water. Your health care provider makes a hole in the amniotic sac using a small instrument. Once the amniotic sac breaks, contractions should begin. This may still take hours to see an effect.   Medicine to trigger or strengthen contractions. This medicine is given through an IV access tube inserted into a  vein in your arm.  All of the methods of induction, besides stripping the membranes, will be done in the hospital. Induction is done in the hospital so that you and the baby can be carefully monitored.  HOW LONG DOES IT TAKE FOR LABOR TO BE INDUCED? Some inductions can take up to 2-3 days. Depending on the cervix, it usually takes less time. It takes longer when you are induced early in the pregnancy or if this is your first pregnancy. If a mother is still pregnant and the induction has been going on for 2-3 days, either the mother will be sent home or a cesarean delivery will be needed. WHAT ARE THE RISKS ASSOCIATED  WITH LABOR INDUCTION? Some of the risks of induction include:   Changes in fetal heart rate, such as too high, too low, or erratic.   Fetal distress.   Chance of infection for the mother and baby.   Increased chance of having a cesarean delivery.   Breaking off (abruption) of the placenta from the uterus (rare).   Uterine rupture (very rare).  When induction is needed for medical reasons, the benefits of induction may outweigh the risks. WHAT ARE SOME REASONS FOR NOT INDUCING LABOR? Labor induction should not be done if:   It is shown that your baby does not tolerate labor.   You have had previous surgeries on your uterus, such as a myomectomy or the removal of fibroids.   Your placenta lies very low in the uterus and blocks the opening of the cervix (placenta previa).   Your baby is not in a head-down position.   The umbilical cord drops down into the birth canal in front of the baby. This could cut off the baby's blood and oxygen supply.   You have had a previous cesarean delivery.   There are unusual circumstances, such as the baby being extremely premature.    This information is not intended to replace advice given to you by your health care provider. Make sure you discuss any questions you have with your health care provider.   Document Released: 02/15/2007 Document Revised: 10/17/2014 Document Reviewed: 04/25/2013 Elsevier Interactive Patient Education [employer]2016 [employer]

## 2016-03-17 NOTE — Progress Notes (Signed)
Subjective:  Katrina Howard is a 35 y.o. [phone] at 4050w5d being seen today for ongoing prenatal care.  She is currently monitored for the following issues for this low-risk pregnancy and has Supervision of high-risk pregnancy; History of preterm delivery, currently pregnant; History of eclampsia; Migraine; Low lying placenta, antepartum; History of placenta abruption; GERD (gastroesophageal reflux disease); Gestational diabetes mellitus (GDM), antepartum; Influenza B; and Influenza B infection in pregnancy on her problem list.  Patient reports contractions since yesterday.  Contractions: Irregular. Vag. Bleeding: None.  Movement: Present. Denies leaking of fluid.   The following portions of the patient's history were reviewed and updated as appropriate: allergies, current medications, past family history, past medical history, past social history, past surgical history and problem list. Problem list updated.  Objective:   Filed Vitals:   03/17/16 0951  BP: 115/67  Pulse: 85  Weight: 210 lb 1.6 oz (95.301 kg)    Fetal Status: Fetal Heart Rate (bpm): NST Fundal Height: 38 cm Movement: Present  Presentation: Vertex  General:  Alert, oriented and cooperative. Patient is in no acute distress.  Skin: Skin is warm and dry. No rash noted.   Cardiovascular: Normal heart rate noted  Respiratory: Normal respiratory effort, no problems with respiration noted  Abdomen: Soft, gravid, appropriate for gestational age. Pain/Pressure: Present     Pelvic: Vag. Bleeding: None Vag D/C Character: White   Cervical exam performed Dilation: 2 Effacement (%): 50 Station: -3  Extremities: Normal range of motion.  Edema: None  Mental Status: Normal mood and affect. Normal behavior. Normal judgment and thought content.   Urinalysis:      Assessment and Plan:  Pregnancy: [id number] at 1550w5d  1. Gestational diabetes mellitus (GDM) controlled on oral hypoglycemic drug, antepartum - Fastings are > 100 and PP are  typically 140s. Patient feels her glyburide "just makes her sugars drop" and reports she "knows they are high because she isn't eating right."  She did not bring her logs today.  - I discussed taking 2 tabs (5mg ) of glyburide when she knows she will not be eating well to reduce BS. I did recommend patient take 5 mg BID but she refused. She agreed to taking 5mg  prn - IOl scheduled for 6/17 - Fetal nonstress test  2. History of eclampsia - Stop ASA  3. History of placenta abruption - Stop ASA  4. History of preterm delivery, currently pregnant, third trimester - Was on 17, now not  5. Low lying placenta, antepartum - 1.7 cm from os last US, repeat done today  6. Supervision of high-risk pregnancy, second trimester - updated box confirmed BTS consent   Term labor symptoms and general obstetric precautions including but not limited to vaginal bleeding, contractions, leaking of fluid and fetal movement were reviewed in detail with the patient. Please refer to After Visit Summary for other counseling recommendations.  Return for Scheduled appts.  Future Appointments Date Time Provider Department Center  03/24/2016 8:30 AM [facility] NST [facility]  03/26/2016 12:00 AM [facility] ROOM [facility] None     Federico FlakeKimberly Niles Diangelo Radel, MD

## 2016-03-19 NOTE — Progress Notes (Signed)
NST 03/14/16 reactive 

## 2016-03-21 ENCOUNTER — Inpatient Hospital Stay (HOSPITAL_COMMUNITY)
Admission: AD | Admit: 2016-03-21 | Discharge: 2016-03-21 | Disposition: A | Discharge status: Home / Self Care | Payer: Medicaid Other | Source: Ambulatory Visit | Attending: Obstetrics and Gynecology | Admitting: Obstetrics and Gynecology

## 2016-03-21 ENCOUNTER — Encounter (HOSPITAL_COMMUNITY): Payer: Self-pay | Admitting: *Deleted

## 2016-03-21 DIAGNOSIS — Z3493 Encounter for supervision of normal pregnancy, unspecified, third trimester: Secondary | ICD-10-CM | POA: Diagnosis present

## 2016-03-21 NOTE — MAU Note (Signed)
Contractions and cramping started after she went to the bathroom at 0700 this morning.  Having a lot of mucous d/c, small amt of water

## 2016-03-21 NOTE — Discharge Instructions (Signed)
Fetal Movement Counts Patient Name: __________________________________________________ Patient Due Date: ____________________ Performing a fetal movement count is highly recommended in high-risk pregnancies, but it is good for every pregnant woman to do. Your health care provider may ask you to start counting fetal movements at 28 weeks of the pregnancy. Fetal movements often increase:  After eating a full meal.  After physical activity.  After eating or drinking something sweet or cold.  At rest. Pay attention to when you feel the baby is most active. This will help you notice a pattern of your baby's sleep and wake cycles and what factors contribute to an increase in fetal movement. It is important to perform a fetal movement count at the same time each day when your baby is normally most active.  HOW TO COUNT FETAL MOVEMENTS 1. Find a quiet and comfortable area to sit or lie down on your left side. Lying on your left side provides the best blood and oxygen circulation to your baby. 2. Write down the day and time on a sheet of paper or in a journal. 3. Start counting kicks, flutters, swishes, rolls, or jabs in a 2-hour period. You should feel at least 10 movements within 2 hours. 4. If you do not feel 10 movements in 2 hours, wait 2-3 hours and count again. Look for a change in the pattern or not enough counts in 2 hours. SEEK MEDICAL CARE IF:  You feel less than 10 counts in 2 hours, tried twice.  There is no movement in over an hour.  The pattern is changing or taking longer each day to reach 10 counts in 2 hours.  You feel the baby is not moving as he or she usually does. Date: ____________ Movements: ____________ Start time: ____________ Finish time: ____________  Date: ____________ Movements: ____________ Start time: ____________ Finish time: ____________ Date: ____________ Movements: ____________ Start time: ____________ Finish time: ____________ Date: ____________ Movements:  ____________ Start time: ____________ Finish time: ____________ Date: ____________ Movements: ____________ Start time: ____________ Finish time: ____________ Date: ____________ Movements: ____________ Start time: ____________ Finish time: ____________ Date: ____________ Movements: ____________ Start time: ____________ Finish time: ____________ Date: ____________ Movements: ____________ Start time: ____________ Finish time: ____________  Date: ____________ Movements: ____________ Start time: ____________ Finish time: ____________ Date: ____________ Movements: ____________ Start time: ____________ Finish time: ____________ Date: ____________ Movements: ____________ Start time: ____________ Finish time: ____________ Date: ____________ Movements: ____________ Start time: ____________ Finish time: ____________ Date: ____________ Movements: ____________ Start time: ____________ Finish time: ____________ Date: ____________ Movements: ____________ Start time: ____________ Finish time: ____________ Date: ____________ Movements: ____________ Start time: ____________ Finish time: ____________  Date: ____________ Movements: ____________ Start time: ____________ Finish time: ____________ Date: ____________ Movements: ____________ Start time: ____________ Finish time: ____________ Date: ____________ Movements: ____________ Start time: ____________ Finish time: ____________ Date: ____________ Movements: ____________ Start time: ____________ Finish time: ____________ Date: ____________ Movements: ____________ Start time: ____________ Finish time: ____________ Date: ____________ Movements: ____________ Start time: ____________ Finish time: ____________ Date: ____________ Movements: ____________ Start time: ____________ Finish time: ____________  Date: ____________ Movements: ____________ Start time: ____________ Finish time: ____________ Date: ____________ Movements: ____________ Start time: ____________ Finish  time: ____________ Date: ____________ Movements: ____________ Start time: ____________ Finish time: ____________ Date: ____________ Movements: ____________ Start time: ____________ Finish time: ____________ Date: ____________ Movements: ____________ Start time: ____________ Finish time: ____________ Date: ____________ Movements: ____________ Start time: ____________ Finish time: ____________ Date: ____________ Movements: ____________ Start time: ____________ Finish time: ____________  Date: ____________ Movements: ____________ Start time: ____________ Finish   time: ____________ Date: ____________ Movements: ____________ Start time: ____________ Finish time: ____________ Date: ____________ Movements: ____________ Start time: ____________ Finish time: ____________ Date: ____________ Movements: ____________ Start time: ____________ Finish time: ____________ Date: ____________ Movements: ____________ Start time: ____________ Finish time: ____________ Date: ____________ Movements: ____________ Start time: ____________ Finish time: ____________ Date: ____________ Movements: ____________ Start time: ____________ Finish time: ____________  Date: ____________ Movements: ____________ Start time: ____________ Finish time: ____________ Date: ____________ Movements: ____________ Start time: ____________ Finish time: ____________ Date: ____________ Movements: ____________ Start time: ____________ Finish time: ____________ Date: ____________ Movements: ____________ Start time: ____________ Finish time: ____________ Date: ____________ Movements: ____________ Start time: ____________ Finish time: ____________ Date: ____________ Movements: ____________ Start time: ____________ Finish time: ____________ Date: ____________ Movements: ____________ Start time: ____________ Finish time: ____________  Date: ____________ Movements: ____________ Start time: ____________ Finish time: ____________ Date: ____________  Movements: ____________ Start time: ____________ Finish time: ____________ Date: ____________ Movements: ____________ Start time: ____________ Finish time: ____________ Date: ____________ Movements: ____________ Start time: ____________ Finish time: ____________ Date: ____________ Movements: ____________ Start time: ____________ Finish time: ____________ Date: ____________ Movements: ____________ Start time: ____________ Finish time: ____________ Date: ____________ Movements: ____________ Start time: ____________ Finish time: ____________  Date: ____________ Movements: ____________ Start time: ____________ Finish time: ____________ Date: ____________ Movements: ____________ Start time: ____________ Finish time: ____________ Date: ____________ Movements: ____________ Start time: ____________ Finish time: ____________ Date: ____________ Movements: ____________ Start time: ____________ Finish time: ____________ Date: ____________ Movements: ____________ Start time: ____________ Finish time: ____________ Date: ____________ Movements: ____________ Start time: ____________ Finish time: ____________   This information is not intended to replace advice given to you by your health care provider. Make sure you discuss any questions you have with your health care provider.   Document Released: 10/26/2006 Document Revised: 10/17/2014 Document Reviewed: 07/23/2012 Elsevier Interactive Patient Education 2016 [employer] Braxton Hicks Contractions Contractions of the uterus can occur throughout pregnancy. Contractions are not always a sign that you are in labor.  WHAT ARE BRAXTON HICKS CONTRACTIONS?  Contractions that occur before labor are called Braxton Hicks contractions, or false labor. Toward the end of pregnancy (32-34 weeks), these contractions can develop more often and may become more forceful. This is not true labor because these contractions do not result in opening (dilatation) and thinning of  the cervix. They are sometimes difficult to tell apart from true labor because these contractions can be forceful and people have different pain tolerances. You should not feel embarrassed if you go to the hospital with false labor. Sometimes, the only way to tell if you are in true labor is for your health care provider to look for changes in the cervix. If there are no prenatal problems or other health problems associated with the pregnancy, it is completely safe to be sent home with false labor and await the onset of true labor. HOW CAN YOU TELL THE DIFFERENCE BETWEEN TRUE AND FALSE LABOR? False Labor  The contractions of false labor are usually shorter and not as hard as those of true labor.   The contractions are usually irregular.   The contractions are often felt in the front of the lower abdomen and in the groin.   The contractions may go away when you walk around or change positions while lying down.   The contractions get weaker and are shorter lasting as time goes on.   The contractions do not usually become progressively stronger, regular, and closer together as with true labor.  True Labor 5. Contractions in true   labor last 30-70 seconds, become very regular, usually become more intense, and increase in frequency.  6. The contractions do not go away with walking.  7. The discomfort is usually felt in the top of the uterus and spreads to the lower abdomen and low back.  8. True labor can be determined by your health care provider with an exam. This will show that the cervix is dilating and getting thinner.  WHAT TO REMEMBER  Keep up with your usual exercises and follow other instructions given by your health care provider.   Take medicines as directed by your health care provider.   Keep your regular prenatal appointments.   Eat and drink lightly if you think you are going into labor.   If Braxton Hicks contractions are making you uncomfortable:   Change  your position from lying down or resting to walking, or from walking to resting.   Sit and rest in a tub of warm water.   Drink 2-3 glasses of water. Dehydration may cause these contractions.   Do slow and deep breathing several times an hour.  WHEN SHOULD I SEEK IMMEDIATE MEDICAL CARE? Seek immediate medical care if:  Your contractions become stronger, more regular, and closer together.   You have fluid leaking or gushing from your vagina.   You have a fever.   You pass blood-tinged mucus.   You have vaginal bleeding.   You have continuous abdominal pain.   You have low back pain that you never had before.   You feel your baby's head pushing down and causing pelvic pressure.   Your baby is not moving as much as it used to.    This information is not intended to replace advice given to you by your health care provider. Make sure you discuss any questions you have with your health care provider.   Document Released: 09/26/2005 Document Revised: 10/01/2013 Document Reviewed: 07/08/2013 Elsevier Interactive Patient Education 2016 [employer] Inc[employer]  

## 2016-03-21 NOTE — MAU Note (Signed)
Urine in lab 

## 2016-03-22 ENCOUNTER — Encounter: Payer: Self-pay | Admitting: General Practice

## 2016-03-24 ENCOUNTER — Ambulatory Visit (INDEPENDENT_AMBULATORY_CARE_PROVIDER_SITE_OTHER): Payer: Medicaid Other | Admitting: Obstetrics & Gynecology

## 2016-03-24 VITALS — BP 116/73 | HR 94 | Wt 212.8 lb

## 2016-03-24 DIAGNOSIS — Z36 Encounter for antenatal screening of mother: Secondary | ICD-10-CM

## 2016-03-24 DIAGNOSIS — O09212 Supervision of pregnancy with history of pre-term labor, second trimester: Secondary | ICD-10-CM

## 2016-03-24 DIAGNOSIS — O09213 Supervision of pregnancy with history of pre-term labor, third trimester: Secondary | ICD-10-CM | POA: Diagnosis not present

## 2016-03-24 DIAGNOSIS — O24415 Gestational diabetes mellitus in pregnancy, controlled by oral hypoglycemic drugs: Secondary | ICD-10-CM

## 2016-03-24 LAB — POCT URINALYSIS DIP (DEVICE)
BILIRUBIN URINE: NEGATIVE
GLUCOSE, UA: NEGATIVE mg/dL
Hgb urine dipstick: NEGATIVE
KETONES UR: NEGATIVE mg/dL
LEUKOCYTES UA: NEGATIVE
Nitrite: NEGATIVE
Protein, ur: 30 mg/dL — AB
SPECIFIC GRAVITY, URINE: 1.015 (ref 1.005–1.030)
Urobilinogen, UA: 0.2 mg/dL (ref 0.0–1.0)
pH: 5.5 (ref 5.0–8.0)

## 2016-03-24 NOTE — Progress Notes (Signed)
NST reactive  Subjective:  Katrina Howard is a 35 y.o. [id number] at 1048w5d being seen today for ongoing prenatal care.  She is currently monitored for the following issues for this high-risk pregnancy and has Supervision of high-risk pregnancy; History of preterm delivery, currently pregnant; History of eclampsia; Migraine; Low lying placenta, antepartum; History of placenta abruption; GERD (gastroesophageal reflux disease); Gestational diabetes mellitus (GDM), antepartum; Influenza B; and Influenza B infection in pregnancy on her problem list.  Patient reports no complaints.  Contractions: Irregular. Vag. Bleeding: None.  Movement: Present. Denies leaking of fluid.   The following portions of the patient's history were reviewed and updated as appropriate: allergies, current medications, past family history, past medical history, past social history, past surgical history and problem list. Problem list updated.  Objective:   Filed Vitals:   03/24/16 0911  BP: 116/73  Pulse: 94  Weight: 212 lb 12.8 oz (96.525 kg)    Fetal Status: Fetal Heart Rate (bpm): NST   Movement: Present     General:  Alert, oriented and cooperative. Patient is in no acute distress.  Skin: Skin is warm and dry. No rash noted.   Cardiovascular: Normal heart rate noted  Respiratory: Normal respiratory effort, no problems with respiration noted  Abdomen: Soft, gravid, appropriate for gestational age. Pain/Pressure: Present     Pelvic: Cervical exam deferred        Extremities: Normal range of motion.  Edema: None  Mental Status: Normal mood and affect. Normal behavior. Normal judgment and thought content.   Urinalysis:      Assessment and Plan:  Pregnancy: [id number] at 248w5d  1. Gestational diabetes mellitus (GDM) controlled on oral hypoglycemic drug, antepartum Fair control, BG log reviewed IOL 39 weeks  Term labor symptoms and general obstetric precautions including but not limited to vaginal bleeding,  contractions, leaking of fluid and fetal movement were reviewed in detail with the patient. Please refer to After Visit Summary for other counseling recommendations.  Return in about 6 weeks (around 05/05/2016). For PP exam  Adam PhenixJames G Arnold, MD

## 2016-03-24 NOTE — Progress Notes (Signed)
IOL scheduled 6/17 @ midnight

## 2016-03-24 NOTE — Patient Instructions (Signed)
Labor Induction Labor induction is when steps are taken to cause a pregnant woman to begin the labor process. Most women go into labor on their own between 37 weeks and 42 weeks of the pregnancy. When this does not happen or when there is a medical need, methods may be used to induce labor. Labor induction causes a pregnant woman's uterus to contract. It also causes the cervix to soften (ripen), open (dilate), and thin out (efface). Usually, labor is not induced before 39 weeks of the pregnancy unless there is a problem with the baby or mother.  Before inducing labor, your health care provider will consider a number of factors, including the following:  The medical condition of you and the baby.   How many weeks along you are.   The status of the baby's lung maturity.   The condition of the cervix.   The position of the baby.  WHAT ARE THE REASONS FOR LABOR INDUCTION? Labor may be induced for the following reasons:  The health of the baby or mother is at risk.   The pregnancy is overdue by 1 week or more.   The water breaks but labor does not start on its own.   The mother has a health condition or serious illness, such as high blood pressure, infection, placental abruption, or diabetes.  The amniotic fluid amounts are low around the baby.   The baby is distressed.  Convenience or wanting the baby to be born on a certain date is not a reason for inducing labor. WHAT METHODS ARE USED FOR LABOR INDUCTION? Several methods of labor induction may be used, such as:   Prostaglandin medicine. This medicine causes the cervix to dilate and ripen. The medicine will also start contractions. It can be taken by mouth or by inserting a suppository into the vagina.   Inserting a thin tube (catheter) with a balloon on the end into the vagina to dilate the cervix. Once inserted, the balloon is expanded with water, which causes the cervix to open.   Stripping the membranes. Your health  care provider separates amniotic sac tissue from the cervix, causing the cervix to be stretched and causing the release of a hormone called progesterone. This may cause the uterus to contract. It is often done during an office visit. You will be sent home to wait for the contractions to begin. You will then come in for an induction.   Breaking the water. Your health care provider makes a hole in the amniotic sac using a small instrument. Once the amniotic sac breaks, contractions should begin. This may still take hours to see an effect.   Medicine to trigger or strengthen contractions. This medicine is given through an IV access tube inserted into a vein in your arm.  All of the methods of induction, besides stripping the membranes, will be done in the hospital. Induction is done in the hospital so that you and the baby can be carefully monitored.  HOW LONG DOES IT TAKE FOR LABOR TO BE INDUCED? Some inductions can take up to 2-3 days. Depending on the cervix, it usually takes less time. It takes longer when you are induced early in the pregnancy or if this is your first pregnancy. If a mother is still pregnant and the induction has been going on for 2-3 days, either the mother will be sent home or a cesarean delivery will be needed. WHAT ARE THE RISKS ASSOCIATED WITH LABOR INDUCTION? Some of the risks of induction include:     Changes in fetal heart rate, such as too high, too low, or erratic.   Fetal distress.   Chance of infection for the mother and baby.   Increased chance of having a cesarean delivery.   Breaking off (abruption) of the placenta from the uterus (rare).   Uterine rupture (very rare).  When induction is needed for medical reasons, the benefits of induction may outweigh the risks. WHAT ARE SOME REASONS FOR NOT INDUCING LABOR? Labor induction should not be done if:   It is shown that your baby does not tolerate labor.   You have had previous surgeries on your  uterus, such as a myomectomy or the removal of fibroids.   Your placenta lies very low in the uterus and blocks the opening of the cervix (placenta previa).   Your baby is not in a head-down position.   The umbilical cord drops down into the birth canal in front of the baby. This could cut off the baby's blood and oxygen supply.   You have had a previous cesarean delivery.   There are unusual circumstances, such as the baby being extremely premature.    This information is not intended to replace advice given to you by your health care provider. Make sure you discuss any questions you have with your health care provider.   Document Released: 02/15/2007 Document Revised: 10/17/2014 Document Reviewed: 04/25/2013 Elsevier Interactive Patient Education 2016 [employer] Inc[employer]  

## 2016-03-26 ENCOUNTER — Encounter (HOSPITAL_COMMUNITY): Payer: Self-pay

## 2016-03-26 ENCOUNTER — Inpatient Hospital Stay (HOSPITAL_COMMUNITY): Payer: Medicaid Other | Admitting: Anesthesiology

## 2016-03-26 ENCOUNTER — Inpatient Hospital Stay (HOSPITAL_COMMUNITY)
Admission: RE | Admit: 2016-03-26 | Discharge: 2016-03-27 | DRG: 775 | Disposition: A | Discharge status: Home / Self Care | Payer: Medicaid Other | Source: Ambulatory Visit | Attending: Obstetrics and Gynecology | Admitting: Obstetrics and Gynecology

## 2016-03-26 DIAGNOSIS — Z833 Family history of diabetes mellitus: Secondary | ICD-10-CM | POA: Diagnosis not present

## 2016-03-26 DIAGNOSIS — O09213 Supervision of pregnancy with history of pre-term labor, third trimester: Secondary | ICD-10-CM

## 2016-03-26 DIAGNOSIS — Z8759 Personal history of other complications of pregnancy, childbirth and the puerperium: Secondary | ICD-10-CM

## 2016-03-26 DIAGNOSIS — O99824 Streptococcus B carrier state complicating childbirth: Secondary | ICD-10-CM | POA: Diagnosis not present

## 2016-03-26 DIAGNOSIS — O24425 Gestational diabetes mellitus in childbirth, controlled by oral hypoglycemic drugs: Principal | ICD-10-CM | POA: Diagnosis present

## 2016-03-26 DIAGNOSIS — Z885 Allergy status to narcotic agent status: Secondary | ICD-10-CM

## 2016-03-26 DIAGNOSIS — Z3A39 39 weeks gestation of pregnancy: Secondary | ICD-10-CM

## 2016-03-26 DIAGNOSIS — O98519 Other viral diseases complicating pregnancy, unspecified trimester: Secondary | ICD-10-CM

## 2016-03-26 DIAGNOSIS — Z79899 Other long term (current) drug therapy: Secondary | ICD-10-CM | POA: Diagnosis not present

## 2016-03-26 DIAGNOSIS — O24415 Gestational diabetes mellitus in pregnancy, controlled by oral hypoglycemic drugs: Secondary | ICD-10-CM

## 2016-03-26 DIAGNOSIS — O0990 Supervision of high risk pregnancy, unspecified, unspecified trimester: Secondary | ICD-10-CM

## 2016-03-26 DIAGNOSIS — Z8751 Personal history of pre-term labor: Secondary | ICD-10-CM

## 2016-03-26 DIAGNOSIS — B349 Viral infection, unspecified: Secondary | ICD-10-CM | POA: Diagnosis present

## 2016-03-26 DIAGNOSIS — K219 Gastro-esophageal reflux disease without esophagitis: Secondary | ICD-10-CM | POA: Diagnosis present

## 2016-03-26 DIAGNOSIS — Z8632 Personal history of gestational diabetes: Secondary | ICD-10-CM

## 2016-03-26 DIAGNOSIS — O09893 Supervision of other high risk pregnancies, third trimester: Secondary | ICD-10-CM

## 2016-03-26 DIAGNOSIS — O24419 Gestational diabetes mellitus in pregnancy, unspecified control: Secondary | ICD-10-CM | POA: Diagnosis present

## 2016-03-26 DIAGNOSIS — O0992 Supervision of high risk pregnancy, unspecified, second trimester: Secondary | ICD-10-CM

## 2016-03-26 DIAGNOSIS — J101 Influenza due to other identified influenza virus with other respiratory manifestations: Secondary | ICD-10-CM | POA: Diagnosis present

## 2016-03-26 DIAGNOSIS — O98513 Other viral diseases complicating pregnancy, third trimester: Secondary | ICD-10-CM

## 2016-03-26 DIAGNOSIS — O4440 Low lying placenta NOS or without hemorrhage, unspecified trimester: Secondary | ICD-10-CM | POA: Diagnosis present

## 2016-03-26 HISTORY — DX: Personal history of gestational diabetes: Z86.32

## 2016-03-26 LAB — GLUCOSE, CAPILLARY
GLUCOSE-CAPILLARY: 141 mg/dL — AB (ref 65–99)
GLUCOSE-CAPILLARY: 85 mg/dL (ref 65–99)
Glucose-Capillary: 104 mg/dL — ABNORMAL HIGH (ref 65–99)
Glucose-Capillary: 99 mg/dL (ref 65–99)
Glucose-Capillary: 130 mg/dL — ABNORMAL HIGH (ref 65–99)

## 2016-03-26 LAB — TYPE AND SCREEN
ABO/RH(D): AB POS
ANTIBODY SCREEN: NEGATIVE

## 2016-03-26 LAB — CBC
HEMATOCRIT: 33.4 % — AB (ref 36.0–46.0)
HEMOGLOBIN: 11.1 g/dL — AB (ref 12.0–15.0)
MCH: 25.2 pg — ABNORMAL LOW (ref 26.0–34.0)
MCHC: 33.2 g/dL (ref 30.0–36.0)
MCV: 75.7 fL — ABNORMAL LOW (ref 78.0–100.0)
Platelets: 195 10*3/uL (ref 150–400)
RBC: 4.41 MIL/uL (ref 3.87–5.11)
RDW: 15.8 % — AB (ref 11.5–15.5)
WBC: 6.7 10*3/uL (ref 4.0–10.5)

## 2016-03-26 LAB — RPR: RPR Ser Ql: NONREACTIVE

## 2016-03-26 LAB — ABO/RH: ABO/RH(D): AB POS

## 2016-03-26 MED ORDER — WITCH HAZEL-GLYCERIN EX PADS
1.0000 | MEDICATED_PAD | CUTANEOUS | Status: DC | PRN
Start: 2016-03-26 — End: 2016-03-27

## 2016-03-26 MED ORDER — OXYTOCIN BOLUS FROM INFUSION
500.0000 mL | INTRAVENOUS | Status: DC
Start: 2016-03-26 — End: 2016-03-27

## 2016-03-26 MED ORDER — SENNOSIDES-DOCUSATE SODIUM 8.6-50 MG PO TABS
2.0000 | ORAL_TABLET | ORAL | Status: DC
  Administered 2016-03-26: 2 via ORAL
  Filled 2016-03-26: qty 2

## 2016-03-26 MED ORDER — DIBUCAINE 1 % RE OINT: 1.0000 "application " | TOPICAL_OINTMENT | RECTAL | Status: DC | PRN

## 2016-03-26 MED ORDER — ONDANSETRON HCL 4 MG/2ML IJ SOLN: 4.0000 mg | INTRAMUSCULAR | Status: DC | PRN

## 2016-03-26 MED ORDER — PHENYLEPHRINE 40 MCG/ML (10ML) SYRINGE FOR IV PUSH (FOR BLOOD PRESSURE SUPPORT)
80.0000 ug | PREFILLED_SYRINGE | INTRAVENOUS | Status: DC | PRN
  Filled 2016-03-26: qty 5

## 2016-03-26 MED ORDER — MEASLES, MUMPS & RUBELLA VAC ~~LOC~~ INJ
0.5000 mL | INJECTION | Freq: Once | SUBCUTANEOUS | Status: DC
  Filled 2016-03-26: qty 0.5

## 2016-03-26 MED ORDER — FENTANYL 2.5 MCG/ML BUPIVACAINE 1/10 % EPIDURAL INFUSION (WH - ANES)
14.0000 mL/h | INTRAMUSCULAR | Status: DC | PRN
  Administered 2016-03-26: 14 mL/h via EPIDURAL
  Filled 2016-03-26: qty 125

## 2016-03-26 MED ORDER — MISOPROSTOL 200 MCG PO TABS
ORAL_TABLET | ORAL | Status: AC
  Filled 2016-03-26: qty 5

## 2016-03-26 MED ORDER — PENICILLIN G POTASSIUM 5000000 UNITS IJ SOLR
5.0000 10*6.[IU] | Freq: Once | INTRAVENOUS | Status: AC
  Administered 2016-03-26: 5 10*6.[IU] via INTRAVENOUS
  Filled 2016-03-26: qty 5

## 2016-03-26 MED ORDER — OXYTOCIN 40 UNITS IN LACTATED RINGERS INFUSION - SIMPLE MED: 2.5000 [IU]/h | INTRAVENOUS | Status: DC

## 2016-03-26 MED ORDER — TERBUTALINE SULFATE 1 MG/ML IJ SOLN
0.2500 mg | Freq: Once | INTRAMUSCULAR | Status: DC | PRN
  Filled 2016-03-26: qty 1

## 2016-03-26 MED ORDER — ONDANSETRON HCL 4 MG/2ML IJ SOLN
4.0000 mg | Freq: Four times a day (QID) | INTRAMUSCULAR | Status: DC | PRN
Start: 2016-03-26 — End: 2016-03-27

## 2016-03-26 MED ORDER — LACTATED RINGERS IV SOLN
INTRAVENOUS | Status: DC
  Administered 2016-03-26 (×2): via INTRAVENOUS

## 2016-03-26 MED ORDER — OXYCODONE-ACETAMINOPHEN 5-325 MG PO TABS
1.0000 | ORAL_TABLET | ORAL | Status: DC | PRN
  Administered 2016-03-26 (×2): 1 via ORAL
  Filled 2016-03-26 (×2): qty 1

## 2016-03-26 MED ORDER — LACTATED RINGERS IV SOLN: 500.0000 mL | Freq: Once | INTRAVENOUS | Status: DC

## 2016-03-26 MED ORDER — PHENYLEPHRINE 40 MCG/ML (10ML) SYRINGE FOR IV PUSH (FOR BLOOD PRESSURE SUPPORT)
80.0000 ug | PREFILLED_SYRINGE | INTRAVENOUS | Status: DC | PRN
  Filled 2016-03-26: qty 5
  Filled 2016-03-26: qty 10

## 2016-03-26 MED ORDER — PRENATAL MULTIVITAMIN CH
1.0000 | ORAL_TABLET | Freq: Every day | ORAL | Status: DC
  Filled 2016-03-26: qty 1

## 2016-03-26 MED ORDER — LIDOCAINE HCL (PF) 1 % IJ SOLN
30.0000 mL | INTRAMUSCULAR | Status: DC | PRN
  Filled 2016-03-26: qty 30

## 2016-03-26 MED ORDER — COCONUT OIL OIL: 1.0000 "application " | TOPICAL_OIL | Status: DC | PRN

## 2016-03-26 MED ORDER — SIMETHICONE 80 MG PO CHEW: 80.0000 mg | CHEWABLE_TABLET | ORAL | Status: DC | PRN

## 2016-03-26 MED ORDER — BENZOCAINE-MENTHOL 20-0.5 % EX AERO
1.0000 "application " | INHALATION_SPRAY | CUTANEOUS | Status: DC | PRN
  Administered 2016-03-26: 1 via TOPICAL
  Filled 2016-03-26: qty 56

## 2016-03-26 MED ORDER — LIDOCAINE HCL (PF) 1 % IJ SOLN
INTRAMUSCULAR | Status: DC | PRN
  Administered 2016-03-26 (×2): 6 mL

## 2016-03-26 MED ORDER — ZOLPIDEM TARTRATE 5 MG PO TABS: 5.0000 mg | ORAL_TABLET | Freq: Every evening | ORAL | Status: DC | PRN

## 2016-03-26 MED ORDER — ONDANSETRON HCL 4 MG PO TABS: 4.0000 mg | ORAL_TABLET | ORAL | Status: DC | PRN

## 2016-03-26 MED ORDER — EPHEDRINE 5 MG/ML INJ
10.0000 mg | INTRAVENOUS | Status: DC | PRN
  Filled 2016-03-26: qty 2

## 2016-03-26 MED ORDER — ACETAMINOPHEN 325 MG PO TABS: 650.0000 mg | ORAL_TABLET | ORAL | Status: DC | PRN

## 2016-03-26 MED ORDER — LACTATED RINGERS IV SOLN: 500.0000 mL | INTRAVENOUS | Status: DC | PRN

## 2016-03-26 MED ORDER — DIPHENHYDRAMINE HCL 50 MG/ML IJ SOLN: 12.5000 mg | INTRAMUSCULAR | Status: DC | PRN

## 2016-03-26 MED ORDER — TETANUS-DIPHTH-ACELL PERTUSSIS 5-2.5-18.5 LF-MCG/0.5 IM SUSP: 0.5000 mL | Freq: Once | INTRAMUSCULAR | Status: DC

## 2016-03-26 MED ORDER — FERROUS SULFATE 325 (65 FE) MG PO TABS
325.0000 mg | ORAL_TABLET | Freq: Two times a day (BID) | ORAL | Status: DC
  Administered 2016-03-26 – 2016-03-27 (×2): 325 mg via ORAL
  Filled 2016-03-26 (×2): qty 1

## 2016-03-26 MED ORDER — OXYCODONE-ACETAMINOPHEN 5-325 MG PO TABS
2.0000 | ORAL_TABLET | ORAL | Status: DC | PRN
  Administered 2016-03-26 – 2016-03-27 (×2): 2 via ORAL
  Filled 2016-03-26 (×2): qty 2

## 2016-03-26 MED ORDER — MAGNESIUM HYDROXIDE 400 MG/5ML PO SUSP: 30.0000 mL | ORAL | Status: DC | PRN

## 2016-03-26 MED ORDER — SOD CITRATE-CITRIC ACID 500-334 MG/5ML PO SOLN: 30.0000 mL | ORAL | Status: DC | PRN

## 2016-03-26 MED ORDER — PENICILLIN G POTASSIUM 5000000 UNITS IJ SOLR
2.5000 10*6.[IU] | INTRAVENOUS | Status: DC
  Administered 2016-03-26 (×2): 2.5 10*6.[IU] via INTRAVENOUS
  Filled 2016-03-26 (×8): qty 2.5

## 2016-03-26 MED ORDER — MISOPROSTOL 200 MCG PO TABS
1000.0000 ug | ORAL_TABLET | Freq: Once | ORAL | Status: AC
  Administered 2016-03-26: 1000 ug via RECTAL

## 2016-03-26 MED ORDER — DIPHENHYDRAMINE HCL 25 MG PO CAPS: 25.0000 mg | ORAL_CAPSULE | Freq: Four times a day (QID) | ORAL | Status: DC | PRN

## 2016-03-26 MED ORDER — IBUPROFEN 600 MG PO TABS
600.0000 mg | ORAL_TABLET | Freq: Four times a day (QID) | ORAL | Status: DC
  Administered 2016-03-26 – 2016-03-27 (×3): 600 mg via ORAL
  Filled 2016-03-26 (×4): qty 1

## 2016-03-26 MED ORDER — OXYTOCIN 40 UNITS IN LACTATED RINGERS INFUSION - SIMPLE MED
1.0000 m[IU]/min | INTRAVENOUS | Status: DC
  Administered 2016-03-26: 4 m[IU]/min via INTRAVENOUS
  Administered 2016-03-26: 2 m[IU]/min via INTRAVENOUS
  Filled 2016-03-26: qty 1000

## 2016-03-26 MED ORDER — MISOPROSTOL 25 MCG QUARTER TABLET
25.0000 ug | ORAL_TABLET | ORAL | Status: DC | PRN
  Administered 2016-03-26: 25 ug via VAGINAL
  Filled 2016-03-26: qty 1
  Filled 2016-03-26 (×2): qty 0.25

## 2016-03-26 MED ORDER — ACETAMINOPHEN 325 MG PO TABS
650.0000 mg | ORAL_TABLET | ORAL | Status: DC | PRN
  Filled 2016-03-26: qty 2

## 2016-03-26 NOTE — Anesthesia Pain Management Evaluation Note (Signed)
  [facility] Pain Management Visit Note  Patient: Katrina Howard, 35 y.o., female  "Hello [facility] am a [facility]. We have an anesthesia team available at all times to provide care throughout the hospital, including epidural management and anesthesia for C-section. I don't know your plan for the delivery whether it a natural birth, water birth, IV sedation, nitrous supplementation, doula or epidural, but we want to meet your pain goals."   1.Was your pain managed to your expectations on prior hospitalizations?   Yes   2.What is your expectation for pain management during this hospitalization?     Epidural  3.How can we help you reach that goal? Epidural  Record the patient's initial score and the patient's pain goal.   Pain: 5  Pain Goal: 3 [facility] wants you to be able to say your pain was always managed very well.  Katrina Howard,Katrina Howard 03/26/2016

## 2016-03-26 NOTE — Anesthesia Procedure Notes (Signed)
Epidural Patient location during procedure: OB  Staffing Anesthesiologist: Sherrian DiversENENNY, Caryle Helgeson  Preanesthetic Checklist Completed: patient identified, site marked, surgical consent, pre-op evaluation, timeout performed, IV checked, risks and benefits discussed and monitors and equipment checked  Epidural Patient position: sitting Prep: DuraPrep Patient monitoring: heart rate and blood pressure Approach: midline Location: L3-L4 Injection technique: LOR saline  Needle:  Needle type: Hustead  Needle gauge: 17 G Needle length: 9 cm Needle insertion depth: 6 cm Catheter type: closed end flexible Catheter size: 19 Gauge Catheter at skin depth: 12 cm Test dose: negative and Other  Assessment Events: blood not aspirated, injection not painful, no injection resistance, negative IV test and no paresthesia  Additional Notes Reason for block:procedure for pain

## 2016-03-26 NOTE — H&P (Signed)
OBSTETRIC ADMISSION HISTORY AND PHYSICAL  Micaila Ziemba is a 35 y.o. female [phone] with IUP at [redacted]w[redacted]d by [place] presenting for IOL for A2GDM. She reports +FMs, No LOF, no VB, no blurry vision, headaches or peripheral edema, and RUQ pain.  She plans on breastfeeding. She request BTS for birth control.  Pregnancy complicated by 1. A2GDM on glyburide 2. H/o eclampsia  3. H/o preterm delivery 4. Low lying placenta-> resolved by 6/8 [place]   Clinic  High Risk - transfer from [facility], [facility] Prenatal Labs  Dating   U/S Blood type: AB/Positive/-- (12/17 0000)   Genetic Screen  Quad:   Neg Antibody:Negative (12/17 0000)  Anatomic US wnl Rubella: Immune (12/17 0000)  GTT Third trimester: 142  3 hr GTT  [phone] > need diabetic teaching RPR: Nonreactive (12/17 0000)   Flu vaccine 07/11/15 HBsAg: Negative (12/17 0000)   TDaP vaccine  Declined                     HIV: Non-reactive (12/17 0000)   Baby Food  breast (wants to pump)                                           GBS: negative  Contraception  BTS- signed consent 5/10 (no BTS until 6/11) Pap: negative  Circumcision  n/a (girl)   Pediatrician  [facility]   Support Person  [place] (boyfriend)     Prenatal History/Complications:  Past Medical History: Past Medical History  Diagnosis Date  . Influenza B 02/04/2016    Positive culture on 01/30/16 - treated with Tamiflu   . Gestational diabetes     takes glyburide    Past Surgical History: Past Surgical History  Procedure Laterality Date  . No past surgeries      Obstetrical History: OB History    Gravida Para Term Preterm AB TAB SAB Ectopic Multiple Living   0 0 0 0 0 2      Social History: Social History   Social History  . Marital Status: Single    Spouse Name: N/A  . Number of Children: N/A  . Years of Education: N/A   Social History Main Topics  . Smoking status: Never Smoker   . Smokeless tobacco: Never Used  . Alcohol Use: No  . Drug  Use: No  . Sexual Activity: Yes   Other Topics Concern  . Not on file   Social History Narrative    Family History: Family History  Problem Relation Age of Onset  . Hypertension Mother   . Diabetes Father     Allergies: Allergies  Allergen Reactions  . Morphine And Related Other (See Comments)    Reaction:  Hallucinations     Prescriptions prior to admission  Medication Sig Dispense Refill Last Dose  . acetaminophen (TYLENOL) 500 MG tablet Take 1,000 mg by mouth every 6 (six) hours as needed for mild pain or headache. Reported on 03/24/2016   Not Taking  . cyclobenzaprine (FLEXERIL) 10 MG tablet Take 1 tablet (10 mg total) by mouth at bedtime. 30 tablet 1 Taking  . glyBURIDE (DIABETA) 2.5 MG tablet Take 1 tablet (2.5 mg total) by mouth 2 (two) times daily with a meal. 60 tablet 3 Taking  . Pediatric Multiple Vit-C-FA (FLINSTONES GUMMIES OMEGA-3 DHA) CHEW Chew 2 each by mouth daily.  Taking  . ranitidine (ZANTAC) 150 MG tablet Take 150 mg by mouth 2 (two) times daily as needed for heartburn. Reported on 03/17/2016   Taking     Review of Systems   All systems reviewed and negative except as stated in HPI  Last menstrual period 04/10/2015. General appearance: alert, cooperative and appears stated age Lungs: clear to auscultation bilaterally Heart: regular rate and rhythm Abdomen: soft, non-tender; bowel sounds normal Pelvic: adequate Extremities: Homans sign is negative, no sign of DVT DTR's wnl Presentation: cephalic Fetal monitoringBaseline: 145 bpm, Variability: Good {> 6 bpm), Accelerations: Reactive and Decelerations: Absent Uterine activity None     Prenatal labs: ABO, Rh: AB/Positive/-- (12/17 0000) Antibody: Negative (12/17 0000) Rubella: Immune (12/17 0000) RPR: NON REAC (04/06 [id number])  HBsAg: Negative (12/17 0000)  HIV: NONREACTIVE (04/06 [id number])  GBS: Positive (06/01 0000)  1 hr Glucola failed, and failed 3hr Genetic screening  wnl Anatomy US wnl-  except for posterior placenta previa that later resolved  Prenatal Transfer Tool  Maternal Diabetes: Yes:  Diabetes Type:  Insulin/Medication controlled Genetic Screening: Normal Maternal Ultrasounds/Referrals: Normal Fetal Ultrasounds or other Referrals:  None Maternal Substance Abuse:  No Significant Maternal Medications:  None Significant Maternal Lab Results: Lab values include: Group B Strep positive  No results found for this or any previous visit (from the past 24 hour(s)).  Patient Active Problem List   Diagnosis Date Noted  . Gestational diabetes mellitus (GDM) controlled on oral hypoglycemic drug, antepartum 03/26/2016  . Influenza B 02/04/2016  . Influenza B infection in pregnancy 02/04/2016  . Gestational diabetes mellitus (GDM), antepartum 01/23/2016  . History of placenta abruption 01/07/2016  . GERD (gastroesophageal reflux disease) 01/07/2016  . Low lying placenta, antepartum 12/10/2015  . Supervision of high-risk pregnancy 09/29/2015  . History of preterm delivery, currently pregnant 09/29/2015  . History of eclampsia 09/29/2015  . Migraine 09/29/2015    Assessment/Plan:  Sallyanne KusterKasita Russell is a 35 y.o. [id number] at 669w0d here for IOL for A2GDM  #Labor: Cytotec q4 hours. Plan for pitocin when improved effacement #Pain: Prn med and epidural #FWB:  Cat I #ID:  GBS pos, PCN #MOF: breast #MOC: BTS #Circ:  Na- girl  #A2GDM: CBG q4 hours. EFW 3084g 59th%   Federico FlakeKimberly Niles Maria Gallicchio, MD  03/26/2016, 12:45 AM

## 2016-03-26 NOTE — Anesthesia Preprocedure Evaluation (Addendum)
Anesthesia Evaluation  Patient identified by MRN, date of birth, ID band Patient awake  General Assessment Comment:H/O eclampsia with previous pregnancy. No elevated pressures with this pregnancy.  Gestational diabetes.  Reviewed: Allergy & Precautions, NPO status , Patient's Chart, lab work & pertinent test results  Airway Mallampati: II  TM Distance: >3 FB Neck ROM: Full    Dental no notable dental hx.    Pulmonary neg pulmonary ROS,    Pulmonary exam normal breath sounds clear to auscultation       Cardiovascular negative cardio ROS Normal cardiovascular exam Rhythm:Regular Rate:Normal     Neuro/Psych  Headaches, negative psych ROS   GI/Hepatic Neg liver ROS, GERD  ,  Endo/Other  diabetes, Gestational, Oral Hypoglycemic Agents  Renal/GU negative Renal ROS  negative genitourinary   Musculoskeletal negative musculoskeletal ROS (+)   Abdominal (+) + obese,   Peds negative pediatric ROS (+)  Hematology negative hematology ROS (+)   Anesthesia Other Findings   Reproductive/Obstetrics negative OB ROS                            Anesthesia Physical Anesthesia Plan  ASA: III  Anesthesia Plan: Epidural   Post-op Pain Management:    Induction: Intravenous  Airway Management Planned: Natural Airway  Additional Equipment:   Intra-op Plan:   Post-operative Plan:   Informed Consent: I have reviewed the patients History and Physical, chart, labs and discussed the procedure including the risks, benefits and alternatives for the proposed anesthesia with the patient or authorized representative who has indicated his/her understanding and acceptance.   Dental advisory given  Plan Discussed with: CRNA  Anesthesia Plan Comments: (Informed consent obtained prior to proceeding including risk of failure, 1% risk of PDPH, risk of minor discomfort and bruising.  Discussed rare but serious  complications including epidural abscess, permanent nerve injury, epidural hematoma.  Discussed alternatives to epidural analgesia and patient desires to proceed.  Timeout performed pre-procedure verifying patient name, procedure, and platelet count.  Patient tolerated procedure well. )        Anesthesia Quick Evaluation

## 2016-03-27 LAB — GLUCOSE, CAPILLARY: Glucose-Capillary: 125 mg/dL — ABNORMAL HIGH (ref 65–99)

## 2016-03-27 MED ORDER — IBUPROFEN 600 MG PO TABS: 600.0000 mg | ORAL_TABLET | Freq: Four times a day (QID) | ORAL | Status: DC

## 2016-03-27 MED ORDER — FERROUS SULFATE 325 (65 FE) MG PO TABS: 325.0000 mg | ORAL_TABLET | Freq: Two times a day (BID) | ORAL | Status: DC

## 2016-03-27 NOTE — Lactation Note (Signed)
This note was copied from a baby's chart. Lactation Consultation Note  Patient Name: Girl Sallyanne KusterKasita Byard ZOXWR'UToday's Date: 03/27/2016 Reason for consult: Initial assessment  Visited with Mom, baby 8224 hrs old.  Baby has been fed X 7 formula bottles.  Inquired whether Mom would like assistance with breastfeeding her baby, and Mom stated she didn't want to put baby to the breast.  She stated she wanted to "pump the milk out".  She also stated that no one showed her how to pump after saying she wanted to pump and bottle feed.  Mom is ready to be discharged at present, and a lot of children and family members in the room.  [facility] [place]  Offered to loan her a DEBP for discharge, but Mom declined saying she wasn't interested in pumping.  Talked about engorgement treatment and gave Mom a Harmony Manual Pump with instructions on use and cleaning.   Brochure given, and informed Mom on OP lactation support available to her.  Mom thanked me, but continued to decline any help.  Consult Status Consult Status: Complete    Judee ClaraSmith, Aamya Orellana E 03/27/2016, 2:15 PM

## 2016-03-27 NOTE — Discharge Summary (Addendum)
Patient upset that she will not be discharged with percocet.  Patient refused motrin, acetaminophen at noon.  Patient states that, without percocet, she experiences cramping.  This RN discussed that percocet is not the most effective medication for cramping, and that motrin, acetaminophen and heat work best.  This RN called MD and CNM regarding patient's desire for percocet upon discharge.  Due to the fact that the patient describes the pain is cramping (normal pain after delivery) and the patient is not screaming out in pain, does not have abnormal vital signs, is able to carry on a full conversation, is smiling and laughing with guests, and shows no signs of distress - the MD and CNM do not feel comfortable discharging this patient with percocet for an uncomplicated vaginal delivery. This was explained to the patient.  At 1430, patient up and walking, carrying baby, and able to put baby in crib and pick the baby back out of crib with no signs or complaints of pain or discomfort noted by this RN or stated by patient.

## 2016-03-27 NOTE — Discharge Summary (Signed)
OB Discharge Summary  Patient Name: Katrina Howard DOB: [birth year] MRN: [id number]  Date of admission: 03/26/2016 Delivering MD: Dorathy Kinsman   Date of discharge: 03/27/2016  Admitting diagnosis: INDUCTION Intrauterine pregnancy: [redacted]w[redacted]d     Secondary diagnosis:Principal Problem:   Gestational diabetes mellitus (GDM) controlled on oral hypoglycemic drug, antepartum Active Problems:   Supervision of high-risk pregnancy   History of preterm delivery, currently pregnant   History of eclampsia   Low lying placenta, antepartum   History of placenta abruption   Gestational diabetes mellitus (GDM), antepartum   Influenza B infection in pregnancy   Vaginal delivery  Additional problems:none     Discharge diagnosis: Term Pregnancy Delivered                                                                     Post partum procedures:none  Augmentation: Pitocin  Complications: None  Hospital course:  Onset of Labor With Vaginal Delivery     35 y.o. yo [id number] at [redacted]w[redacted]d was admitted in Active Labor on 03/26/2016. Patient had an uncomplicated labor course as follows:  Membrane Rupture Time/Date: 12:45 PM ,03/26/2016   Intrapartum Procedures: Episiotomy: None [1]                                         Lacerations:  None [1]  Patient had a delivery of a Viable infant. 03/26/2016  Information for the patient's newborn:  Murriel, [place] [[id number]]  Delivery Method: Vaginal, Spontaneous Delivery (Filed from Delivery Summary)    Pateint had an uncomplicated postpartum course.  She is ambulating, tolerating a regular diet, passing flatus, and urinating well. Patient is discharged home in stable condition on 03/27/2016.    Physical exam  Filed Vitals:   03/26/16 1630 03/26/16 2000 03/27/16 0000 03/27/16 0521  BP: 124/80 141/86 122/80 114/75  Pulse: 98 78 74 84  Temp: 98.3 F (36.8 C) 98.1 F (36.7 C) 97.8 F (36.6 C) 98 F (36.7 C)  TempSrc: Oral Oral Oral Oral  Resp:  Height:      Weight:      SpO2: 98% 98%  100%   General: alert, cooperative and no distress Lochia: appropriate Uterine Fundus: firm Incision: N/A DVT Evaluation: No evidence of DVT seen on physical exam. Negative Homan's sign. No cords or calf tenderness. Labs: Lab Results  Component Value Date   WBC 6.7 03/26/2016   HGB 11.1* 03/26/2016   HCT 33.4* 03/26/2016   MCV 75.7* 03/26/2016   PLT 195 03/26/2016   CMP Latest Ref Rng 01/21/2016  Glucose 65 - 104 mg/dL 92  BUN 7 - 25 mg/dL -  Creatinine 1.30 - 8.65 mg/dL -  Sodium 784 - 696 mmol/L -  Potassium 3.5 - 5.3 mmol/L -  Chloride 98 - 110 mmol/L -  CO2 20 - 31 mmol/L -  Calcium 8.6 - 10.2 mg/dL -  Total Protein 6.1 - 8.1 g/dL -  Total Bilirubin 0.2 - 1.2 mg/dL -  Alkaline Phos 33 - 295 U/L -  AST 10 - 30 U/L -  ALT 6 - 29 U/L -  Discharge instruction: per After Visit Summary and "Baby and Me Booklet".  After Visit Meds:    Medication List    ASK your doctor about these medications        cyclobenzaprine 10 MG tablet  Commonly known as:  FLEXERIL  Take 1 tablet (10 mg total) by mouth at bedtime.     FLINSTONES GUMMIES OMEGA-3 DHA Chew  Chew 2 each by mouth daily.     glyBURIDE 2.5 MG tablet  Commonly known as:  DIABETA  Take 1 tablet (2.5 mg total) by mouth 2 (two) times daily with a meal.        Diet: routine diet  Activity: Advance as tolerated. Pelvic rest for 6 weeks.   Outpatient follow up:6 weeks Follow up Appt:No future appointments. Follow up visit: No Follow-up on file.  Postpartum contraception: Nexplanon  Newborn Data: Live born female  Birth Weight: 7 lb 11.6 oz (3505 g) APGAR: 8, 9  Baby Feeding: Breast Disposition:home with mother   03/27/2016 Wyvonnia DuskyMarie Xochitl Egle, CNM

## 2016-03-27 NOTE — Anesthesia Postprocedure Evaluation (Signed)
Anesthesia Post Note  Patient: Katrina Howard  Procedure(s) Performed: * No procedures listed *  Patient location during evaluation: Mother Baby Anesthesia Type: Epidural Level of consciousness: awake Pain management: satisfactory to patient Vital Signs Assessment: post-procedure vital signs reviewed and stable Respiratory status: spontaneous breathing Cardiovascular status: stable Anesthetic complications: no     Last Vitals:  Filed Vitals:   03/27/16 0000 03/27/16 0521  BP: 122/80 114/75  Pulse: 74 84  Temp: 36.6 C 36.7 C  Resp: 16 16    Last Pain:  Filed Vitals:   03/27/16 0541  PainSc: 10-Worst pain ever   Pain Goal: Patients Stated Pain Goal: 2 (03/27/16 0025)               Cephus ShellingBURGER,Aisa Schoeppner

## 2016-03-28 ENCOUNTER — Telehealth: Payer: Self-pay | Admitting: General Practice

## 2016-03-28 ENCOUNTER — Inpatient Hospital Stay (HOSPITAL_COMMUNITY)
Admission: AD | Admit: 2016-03-28 | Discharge: 2016-03-28 | Disposition: A | Discharge status: Home / Self Care | Payer: Medicaid Other | Source: Ambulatory Visit | Attending: Obstetrics and Gynecology | Admitting: Obstetrics and Gynecology

## 2016-03-28 ENCOUNTER — Encounter (HOSPITAL_COMMUNITY): Payer: Self-pay

## 2016-03-28 DIAGNOSIS — R109 Unspecified abdominal pain: Secondary | ICD-10-CM | POA: Diagnosis present

## 2016-03-28 DIAGNOSIS — R102 Pelvic and perineal pain: Secondary | ICD-10-CM | POA: Insufficient documentation

## 2016-03-28 MED ORDER — OXYCODONE-ACETAMINOPHEN 5-325 MG PO TABS: 1.0000 | ORAL_TABLET | Freq: Four times a day (QID) | ORAL | Status: DC | PRN

## 2016-03-28 MED ORDER — IBUPROFEN 600 MG PO TABS: 600.0000 mg | ORAL_TABLET | Freq: Four times a day (QID) | ORAL | Status: AC

## 2016-03-28 MED ORDER — IBUPROFEN 600 MG PO TABS
600.0000 mg | ORAL_TABLET | Freq: Once | ORAL | Status: AC
  Administered 2016-03-28: 600 mg via ORAL
  Filled 2016-03-28: qty 1

## 2016-03-28 MED ORDER — OXYCODONE-ACETAMINOPHEN 5-325 MG PO TABS
1.0000 | ORAL_TABLET | Freq: Once | ORAL | Status: AC
  Administered 2016-03-28: 1 via ORAL
  Filled 2016-03-28: qty 1

## 2016-03-28 NOTE — Telephone Encounter (Signed)
Opened in error

## 2016-03-28 NOTE — MAU Note (Signed)
Pt here with pelvic pain and back pain. Had vaginal delivery on Saturday, was D/C from hospital yesterday.

## 2016-03-28 NOTE — MAU Provider Note (Signed)
History   [id number]   Chief Complaint  Patient presents with  . Abdominal Pain    HPI Katrina Howard is a 35 y.o. female  [phone] here with pain over the symphysis pubis area.  NSVD on 03/26/16 at 2:10 PM and discharged home on PPD#1 at 0800.  Reports pain has increased since after delivery.  Pain worsens when trying to get out of bed or lifting of leg (stairs).  Pain improves if holds hand over area to support.  Reports normal bleeding.  Pain was better controlled while in hospital.  Discharged home with ibuprofen and flexeril.    No LMP recorded.  OB History  Gravida Para Term Preterm AB SAB TAB Ectopic Multiple Living  4 4 2 2  0 0 0 0 0 3    # Outcome Date GA Lbr Len/2nd Weight Sex Delivery Anes PTL Lv  4 Term 03/26/16 5071w0d    Vag-Spont   Y  3 Term 11/22/10 5032w0d  5 lb 13 oz (2.637 kg) M Vag-Spont EPI N Y     Complications: Eclampsia (toxemia of pregnancy)     Comments: seizures during labor  2 Preterm 12/24/06 4240w0d  6 lb 15 oz (3.147 kg) M Vag-Spont EPI Y Y  1 Preterm 06/11/03 4420w0d  1 lb (0.454 kg) M Vag-Spont Other Y FD     Complications: Abruptio Placenta      Past Medical History  Diagnosis Date  . Influenza B 02/04/2016    Positive culture on 01/30/16 - treated with Tamiflu   . Gestational diabetes     takes glyburide    Family History  Problem Relation Age of Onset  . Hypertension Mother   . Diabetes Father     Social History   Social History  . Marital Status: Single    Spouse Name: N/A  . Number of Children: N/A  . Years of Education: N/A   Social History Main Topics  . Smoking status: Never Smoker   . Smokeless tobacco: Never Used  . Alcohol Use: No  . Drug Use: No  . Sexual Activity: Yes   Other Topics Concern  . None   Social History Narrative    Allergies  Allergen Reactions  . Morphine And Related Other (See Comments)    Reaction:  Hallucinations     No current facility-administered medications on file prior to encounter.    Current Outpatient Prescriptions on File Prior to Encounter  Medication Sig Dispense Refill  . cyclobenzaprine (FLEXERIL) 10 MG tablet Take 1 tablet (10 mg total) by mouth at bedtime. 30 tablet 1  . ferrous sulfate 325 (65 FE) MG tablet Take 1 tablet (325 mg total) by mouth 2 (two) times daily with a meal. 60 tablet 3  . ibuprofen (ADVIL,MOTRIN) 600 MG tablet Take 1 tablet (600 mg total) by mouth every 6 (six) hours. 30 tablet 0  . Pediatric Multiple Vit-C-FA (FLINSTONES GUMMIES OMEGA-3 DHA) CHEW Chew 2 each by mouth daily.        Review of Systems  Constitutional: Negative for fever and chills.  Genitourinary: Positive for vaginal bleeding and pelvic pain.  Musculoskeletal: Positive for back pain.  All other systems reviewed and are negative.    Physical Exam   Filed Vitals:   03/28/16 0642 03/28/16 0656  BP:  128/82  Pulse:  71  Temp:  98 F (36.7 C)  TempSrc:  Oral  Resp:  18  Height: 5\' 4"  (1.626 m)   Weight: 202 lb (91.627 kg)  SpO2:  99%    Physical Exam  Constitutional: She is oriented to person, place, and time. She appears well-developed and well-nourished.  Appears uncomfortable  HENT:  Head: Normocephalic.  Neck: Normal range of motion. Neck supple.  Cardiovascular: Normal rate, regular rhythm and normal heart sounds.   Respiratory: Effort normal and breath sounds normal.  GI: Soft.  Genitourinary: There is bleeding (scant; dark red; no bright red or brisk bleed) in the vagina. Vaginal discharge: vaginal bleeding.  Musculoskeletal: Normal range of motion. She exhibits no edema.       Arms: Neurological: She is alert and oriented to person, place, and time. She has normal reflexes.  Skin: Skin is warm and dry.    MAU Course  Procedures  MDM PO Ibuprofen 600 mg and Percocet 5/325  Assessment and Plan  Pubic Symphysis Pain  Plan: Discharge home RX Ibuprofen 600 mg PO prn RX Percocet 5/325 mg (#20) Lower abdominal binder  Marlis Edelson,  CNM 03/28/2016 8:03 AM

## 2016-03-28 NOTE — Discharge Instructions (Signed)

## 2016-04-10 ENCOUNTER — Encounter (HOSPITAL_COMMUNITY): Payer: Self-pay

## 2016-04-20 ENCOUNTER — Ambulatory Visit: Payer: Medicaid Other | Admitting: Advanced Practice Midwife

## 2016-04-20 ENCOUNTER — Encounter: Payer: Self-pay | Admitting: Advanced Practice Midwife

## 2016-04-20 DIAGNOSIS — Z3009 Encounter for other general counseling and advice on contraception: Secondary | ICD-10-CM

## 2016-04-20 DIAGNOSIS — Z8632 Personal history of gestational diabetes: Secondary | ICD-10-CM

## 2016-04-20 DIAGNOSIS — O0990 Supervision of high risk pregnancy, unspecified, unspecified trimester: Secondary | ICD-10-CM

## 2016-04-20 NOTE — Progress Notes (Signed)
Subjective:     Katrina Howard is a 35 y.o. female who presents for a postpartum visit. She is 4 weeks postpartum following a spontaneous vaginal delivery. I have fully reviewed the prenatal and intrapartum course. The delivery was at 39 gestational weeks. Outcome: spontaneous vaginal delivery w/ shoulder dystocia. Anesthesia: epidural. Postpartum course has been Initially complicated by pubic bone pain that has resolved. Baby's course has been uncomplicated. Baby is feeding by bottle. Bleeding staining only. Bowel function is normal. Bladder function is normal. Patient is not sexually active. Contraception method is requesting tubal ligation. Consent signed 02/18/16, under Media tab. Postpartum depression screening: negative.  The following portions of the patient's history were reviewed and updated as appropriate: allergies, current medications, past family history, past medical history, past social history, past surgical history and problem list.  Review of Systems Pertinent items are noted in HPI.   Objective:    There were no vitals taken for this visit.  General:  alert, cooperative, appears stated age and no distress   Breasts:  Declined  Lungs: clear to auscultation bilaterally  Heart:  regular rate and rhythm, S1, S2 normal, no murmur, click, rub or gallop  Abdomen: soft, non-tender; bowel sounds normal; no masses,  no organomegaly   Vulva:  not evaluated  Vagina: not evaluated  Cervix:  declined        Assessment:     Normal postpartum exam. Pap smear not done at today's visit.   Undesired fertility.   Plan:    1. Contraception: tubal ligation 2. R/B/I/A BTL discussed in detail. Pt declined LARC.  3. In-basket message sent to schedule BTL. Will ask MD if she should have separate pr-op visit w/ surgeon or if that can be done when she comes in for the procedure.

## 2016-04-20 NOTE — Patient Instructions (Signed)
Laparoscopic Tubal Ligation Laparoscopic tubal ligation is a procedure that closes the fallopian tubes at a time other than right after childbirth. When the fallopian tubes are closed, the eggs that are released from the ovaries cannot enter the uterus, and sperm cannot reach the egg. Tubal ligation is also known as getting your "tubes tied." Tubal ligation is done so you will not be able to get pregnant or have a baby. Although this procedure may be undone (reversed), it should be considered permanent and irreversible. If you want to have future pregnancies, you should not have this procedure. LET YOUR HEALTH CARE PROVIDER KNOW ABOUT:  Any allergies you have.  All medicines you are taking, including vitamins, herbs, eye drops, creams, and over-the-counter medicines. This includes any use of steroids, either by mouth or in cream form.  Previous problems you or members of your family have had with the use of anesthetics.  Any blood disorders you have.  Previous surgeries you have had.  Any medical conditions you may have.  Possibility of pregnancy, if this applies.  Any past pregnancies. RISKS AND COMPLICATIONS  Infection.  Bleeding.  Injury to surrounding organs.  Side effects from anesthetics.  Failure of the procedure.  Ectopic pregnancy.  Future regret about having the procedure done. BEFORE THE PROCEDURE  Ask your health care provider about:  Changing or stopping your regular medicines. This is especially important if you are taking diabetes medicines or blood thinners.  Taking medicines such as aspirin and ibuprofen. These medicines can thin your blood. Do not take these medicines before your procedure if your health care provider instructs you not to.  Follow instructions from your health care provider about eating and drinking restrictions.  Plan to have someone take you home after the procedure.  If you go home right after the procedure, plan to have someone  with you for 24 hours. PROCEDURE  You will be given one or more of the following:  A medicine that helps you relax (sedative).  A medicine that numbs the area (local anesthetic).  A medicine that makes you fall asleep (general anesthetic).  A medicine that is injected into an area of your body that numbs everything below the injection site (regional anesthetic).  If you have been given general anesthetic, a tube will be put down your throat to help you breathe.  Two small cuts (incisions) will be made in the lower abdominal area and near the belly button.  Your bladder may be emptied with a small tube (catheter).  Your abdomen will be inflated with a safe gas (carbon dioxide). This will help to give the surgeon room to operate and visualize, and it will help the surgeon to avoid other organs.  A thin, lighted tube (laparoscope) with a camera attached will be inserted into your abdomen through one of the incisions near the belly button. Other small instruments will be inserted through the other abdominal incision.  The fallopian tubes will be tied off or burned (cauterized), or they will be blocked with a clip, ring, or clamp. In many cases, a small portion in the center of each fallopian tube will also be removed.  After the fallopian tubes are blocked, the gas will be released from the abdomen.  The incisions will be closed with stitches (sutures).  A bandage (dressing) will be placed over the incisions. The procedure may vary among health care providers and hospitals. AFTER THE PROCEDURE  Your blood pressure, heart rate, breathing rate, and blood oxygen level   will be monitored often until the medicines you were given have worn off.  You will be given pain medicine as needed.  If you had general anesthetic, you may have some mild discomfort in your throat. This is from the breathing tube that was placed in your throat while you were sleeping.  You may experience discomfort in  the shoulder area from some trapped air between your liver and your diaphragm. This sensation is normal, and it will slowly go away on its own.  You will have some mild abdominal discomfort for 3--7 days.   This information is not intended to replace advice given to you by your health care provider. Make sure you discuss any questions you have with your health care provider.   Document Released: 01/02/2001 Document Revised: 02/10/2015 Document Reviewed: 01/07/2012 Elsevier Interactive Patient Education 2016 Elsevier Inc.  

## 2016-04-21 ENCOUNTER — Encounter (HOSPITAL_COMMUNITY): Payer: Self-pay | Admitting: *Deleted

## 2016-04-22 ENCOUNTER — Encounter (HOSPITAL_COMMUNITY): Payer: Self-pay | Admitting: *Deleted

## 2016-04-25 ENCOUNTER — Encounter: Payer: Self-pay | Admitting: Advanced Practice Midwife

## 2016-05-06 ENCOUNTER — Ambulatory Visit: Payer: Medicaid Other | Admitting: Obstetrics & Gynecology

## 2016-05-09 ENCOUNTER — Encounter (HOSPITAL_COMMUNITY): Payer: Self-pay | Admitting: *Deleted

## 2016-05-10 ENCOUNTER — Ambulatory Visit (HOSPITAL_COMMUNITY): Payer: Medicaid Other | Admitting: Anesthesiology

## 2016-05-10 ENCOUNTER — Encounter (HOSPITAL_COMMUNITY): Payer: Self-pay

## 2016-05-10 ENCOUNTER — Ambulatory Visit (HOSPITAL_COMMUNITY)
Admission: RE | Admit: 2016-05-10 | Discharge: 2016-05-10 | Disposition: A | Discharge status: Home / Self Care | Payer: Medicaid Other | Source: Ambulatory Visit | Attending: Obstetrics & Gynecology | Admitting: Obstetrics & Gynecology

## 2016-05-10 ENCOUNTER — Encounter (HOSPITAL_COMMUNITY)
Admission: RE | Disposition: A | Discharge status: Home / Self Care | Payer: Self-pay | Source: Ambulatory Visit | Attending: Obstetrics & Gynecology

## 2016-05-10 DIAGNOSIS — K219 Gastro-esophageal reflux disease without esophagitis: Secondary | ICD-10-CM | POA: Diagnosis not present

## 2016-05-10 DIAGNOSIS — Z302 Encounter for sterilization: Secondary | ICD-10-CM | POA: Diagnosis not present

## 2016-05-10 HISTORY — PX: LAPAROSCOPIC TUBAL LIGATION: SHX1937

## 2016-05-10 LAB — CBC
HCT: 39.8 % (ref 36.0–46.0)
HEMOGLOBIN: 13.1 g/dL (ref 12.0–15.0)
MCH: 25 pg — AB (ref 26.0–34.0)
MCHC: 32.9 g/dL (ref 30.0–36.0)
MCV: 76 fL — AB (ref 78.0–100.0)
Platelets: 244 10*3/uL (ref 150–400)
RBC: 5.24 MIL/uL — ABNORMAL HIGH (ref 3.87–5.11)
RDW: 17 % — ABNORMAL HIGH (ref 11.5–15.5)
WBC: 6.2 10*3/uL (ref 4.0–10.5)

## 2016-05-10 LAB — PREGNANCY, URINE: Preg Test, Ur: NEGATIVE

## 2016-05-10 SURGERY — LIGATION, FALLOPIAN TUBE, LAPAROSCOPIC
Anesthesia: General | Site: Abdomen | Laterality: Bilateral

## 2016-05-10 MED ORDER — OXYCODONE-ACETAMINOPHEN 5-325 MG PO TABS
1.0000 | ORAL_TABLET | Freq: Once | ORAL | Status: AC
  Administered 2016-05-10: 1 via ORAL

## 2016-05-10 MED ORDER — KETOROLAC TROMETHAMINE 30 MG/ML IJ SOLN
INTRAMUSCULAR | Status: DC | PRN
  Administered 2016-05-10: 30 mg via INTRAVENOUS

## 2016-05-10 MED ORDER — GLYCOPYRROLATE 0.2 MG/ML IJ SOLN
INTRAMUSCULAR | Status: DC | PRN
  Administered 2016-05-10: 0.2 mg via INTRAVENOUS

## 2016-05-10 MED ORDER — KETOROLAC TROMETHAMINE 30 MG/ML IJ SOLN: 30.0000 mg | Freq: Once | INTRAMUSCULAR | Status: DC

## 2016-05-10 MED ORDER — LIDOCAINE HCL (CARDIAC) 20 MG/ML IV SOLN
INTRAVENOUS | Status: DC | PRN
  Administered 2016-05-10: 60 mg via INTRAVENOUS

## 2016-05-10 MED ORDER — FENTANYL CITRATE (PF) 100 MCG/2ML IJ SOLN
25.0000 ug | INTRAMUSCULAR | Status: DC | PRN
  Administered 2016-05-10 (×4): 50 ug via INTRAVENOUS

## 2016-05-10 MED ORDER — KETOROLAC TROMETHAMINE 30 MG/ML IJ SOLN
INTRAMUSCULAR | Status: AC
  Filled 2016-05-10: qty 1

## 2016-05-10 MED ORDER — SCOPOLAMINE 1 MG/3DAYS TD PT72
MEDICATED_PATCH | TRANSDERMAL | Status: AC
  Filled 2016-05-10: qty 1

## 2016-05-10 MED ORDER — LACTATED RINGERS IV SOLN
INTRAVENOUS | Status: DC
  Administered 2016-05-10 (×2): via INTRAVENOUS

## 2016-05-10 MED ORDER — SUGAMMADEX SODIUM 200 MG/2ML IV SOLN
INTRAVENOUS | Status: DC | PRN
  Administered 2016-05-10: 200 mg via INTRAVENOUS

## 2016-05-10 MED ORDER — FENTANYL CITRATE (PF) 100 MCG/2ML IJ SOLN
INTRAMUSCULAR | Status: AC
  Filled 2016-05-10: qty 2

## 2016-05-10 MED ORDER — KETOROLAC TROMETHAMINE 30 MG/ML IJ SOLN
30.0000 mg | Freq: Once | INTRAMUSCULAR | Status: AC
  Administered 2016-05-10: 30 mg via INTRAMUSCULAR

## 2016-05-10 MED ORDER — OXYCODONE-ACETAMINOPHEN 5-325 MG PO TABS: 1.0000 | ORAL_TABLET | Freq: Four times a day (QID) | ORAL | Status: DC | PRN

## 2016-05-10 MED ORDER — ACETAMINOPHEN 160 MG/5ML PO SOLN: 325.0000 mg | ORAL | Status: DC | PRN

## 2016-05-10 MED ORDER — PROPOFOL 10 MG/ML IV BOLUS
INTRAVENOUS | Status: DC | PRN
  Administered 2016-05-10: 200 mg via INTRAVENOUS

## 2016-05-10 MED ORDER — BUPIVACAINE HCL (PF) 0.5 % IJ SOLN
INTRAMUSCULAR | Status: AC
  Filled 2016-05-10: qty 30

## 2016-05-10 MED ORDER — BUPIVACAINE HCL (PF) 0.5 % IJ SOLN
INTRAMUSCULAR | Status: DC | PRN
  Administered 2016-05-10: 20 mL

## 2016-05-10 MED ORDER — SUGAMMADEX SODIUM 200 MG/2ML IV SOLN
INTRAVENOUS | Status: AC
  Filled 2016-05-10: qty 2

## 2016-05-10 MED ORDER — MIDAZOLAM HCL 2 MG/2ML IJ SOLN
INTRAMUSCULAR | Status: AC
  Filled 2016-05-10: qty 2

## 2016-05-10 MED ORDER — SUCCINYLCHOLINE CHLORIDE 20 MG/ML IJ SOLN
INTRAMUSCULAR | Status: DC | PRN
  Administered 2016-05-10: 100 mg via INTRAVENOUS

## 2016-05-10 MED ORDER — ROCURONIUM BROMIDE 100 MG/10ML IV SOLN
INTRAVENOUS | Status: AC
  Filled 2016-05-10: qty 1

## 2016-05-10 MED ORDER — OXYCODONE-ACETAMINOPHEN 5-325 MG PO TABS
ORAL_TABLET | ORAL | Status: AC
  Filled 2016-05-10: qty 1

## 2016-05-10 MED ORDER — DEXAMETHASONE SODIUM PHOSPHATE 4 MG/ML IJ SOLN
INTRAMUSCULAR | Status: AC
  Filled 2016-05-10: qty 1

## 2016-05-10 MED ORDER — ROCURONIUM BROMIDE 100 MG/10ML IV SOLN
INTRAVENOUS | Status: DC | PRN
  Administered 2016-05-10: 15 mg via INTRAVENOUS
  Administered 2016-05-10: 5 mg via INTRAVENOUS

## 2016-05-10 MED ORDER — SCOPOLAMINE 1 MG/3DAYS TD PT72
1.0000 | MEDICATED_PATCH | Freq: Once | TRANSDERMAL | Status: DC
  Administered 2016-05-10: 1.5 mg via TRANSDERMAL

## 2016-05-10 MED ORDER — LIDOCAINE HCL (CARDIAC) 20 MG/ML IV SOLN
INTRAVENOUS | Status: AC
  Filled 2016-05-10: qty 5

## 2016-05-10 MED ORDER — MEPERIDINE HCL 25 MG/ML IJ SOLN: 6.2500 mg | INTRAMUSCULAR | Status: DC | PRN

## 2016-05-10 MED ORDER — ONDANSETRON HCL 4 MG/2ML IJ SOLN
INTRAMUSCULAR | Status: DC | PRN
  Administered 2016-05-10: 4 mg via INTRAVENOUS

## 2016-05-10 MED ORDER — FENTANYL CITRATE (PF) 250 MCG/5ML IJ SOLN
INTRAMUSCULAR | Status: AC
  Filled 2016-05-10: qty 5

## 2016-05-10 MED ORDER — ONDANSETRON HCL 4 MG/2ML IJ SOLN: 4.0000 mg | Freq: Once | INTRAMUSCULAR | Status: DC | PRN

## 2016-05-10 MED ORDER — ONDANSETRON HCL 4 MG/2ML IJ SOLN
INTRAMUSCULAR | Status: AC
  Filled 2016-05-10: qty 2

## 2016-05-10 MED ORDER — PROPOFOL 10 MG/ML IV BOLUS
INTRAVENOUS | Status: AC
  Filled 2016-05-10: qty 20

## 2016-05-10 MED ORDER — DEXAMETHASONE SODIUM PHOSPHATE 10 MG/ML IJ SOLN
INTRAMUSCULAR | Status: DC | PRN
  Administered 2016-05-10: 4 mg via INTRAVENOUS

## 2016-05-10 MED ORDER — FENTANYL CITRATE (PF) 100 MCG/2ML IJ SOLN
INTRAMUSCULAR | Status: DC | PRN
Start: 2016-05-10 — End: 2016-05-10
  Administered 2016-05-10: 100 ug via INTRAVENOUS
  Administered 2016-05-10: 50 ug via INTRAVENOUS
  Administered 2016-05-10: 100 ug via INTRAVENOUS

## 2016-05-10 MED ORDER — FENTANYL CITRATE (PF) 100 MCG/2ML IJ SOLN
INTRAMUSCULAR | Status: AC
  Administered 2016-05-10: 50 ug via INTRAVENOUS
  Filled 2016-05-10: qty 2

## 2016-05-10 MED ORDER — ACETAMINOPHEN 325 MG PO TABS: 325.0000 mg | ORAL_TABLET | ORAL | Status: DC | PRN

## 2016-05-10 MED ORDER — MIDAZOLAM HCL 2 MG/2ML IJ SOLN
INTRAMUSCULAR | Status: DC | PRN
  Administered 2016-05-10: 2 mg via INTRAVENOUS

## 2016-05-10 SURGICAL SUPPLY — 23 items
CATH ROBINSON RED A/P 16FR (CATHETERS) ×3 IMPLANT
CLIP FILSHIE TUBAL LIGA STRL (Clip) ×3 IMPLANT
CLOTH BEACON ORANGE TIMEOUT ST (SAFETY) ×3 IMPLANT
DRSG OPSITE POSTOP 3X4 (GAUZE/BANDAGES/DRESSINGS) IMPLANT
DURAPREP 26ML APPLICATOR (WOUND CARE) ×3 IMPLANT
GLOVE BIO SURGEON STRL SZ 6.5 (GLOVE) ×2 IMPLANT
GLOVE BIO SURGEONS STRL SZ 6.5 (GLOVE) ×1
GLOVE BIOGEL PI IND STRL 7.0 (GLOVE) ×1 IMPLANT
GLOVE BIOGEL PI INDICATOR 7.0 (GLOVE) ×2
GOWN STRL REUS W/TWL LRG LVL3 (GOWN DISPOSABLE) ×6 IMPLANT
NDL SAFETY ECLIPSE 18X1.5 (NEEDLE) ×1 IMPLANT
NEEDLE HYPO 18GX1.5 SHARP (NEEDLE) ×2
NEEDLE INSUFFLATION 120MM (ENDOMECHANICALS) ×3 IMPLANT
NS IRRIG 1000ML POUR BTL (IV SOLUTION) ×3 IMPLANT
PACK LAPAROSCOPY BASIN (CUSTOM PROCEDURE TRAY) ×3 IMPLANT
PAD TRENDELENBURG POSITION (MISCELLANEOUS) ×3 IMPLANT
SLEEVE XCEL OPT CAN 5 100 (ENDOMECHANICALS) ×3 IMPLANT
SUT VICRYL 0 UR6 27IN ABS (SUTURE) ×3 IMPLANT
SUT VICRYL 4-0 PS2 18IN ABS (SUTURE) ×3 IMPLANT
TOWEL OR 17X24 6PK STRL BLUE (TOWEL DISPOSABLE) ×6 IMPLANT
TROCAR OPTI TIP 5M 100M (ENDOMECHANICALS) ×3 IMPLANT
TROCAR XCEL DIL TIP R 11M (ENDOMECHANICALS) ×3 IMPLANT
WARMER LAPAROSCOPE (MISCELLANEOUS) ×3 IMPLANT

## 2016-05-10 NOTE — Anesthesia Postprocedure Evaluation (Signed)
Anesthesia Post Note  Patient: Katrina Howard  Procedure(s) Performed: Procedure(s) (LRB): LAPAROSCOPIC TUBAL LIGATION (Bilateral)  Patient location during evaluation: PACU Anesthesia Type: General Pain management: pain level controlled Vital Signs Assessment: post-procedure vital signs reviewed and stable Respiratory status: spontaneous breathing Cardiovascular status: stable Postop Assessment: no signs of nausea or vomiting Anesthetic complications: no     Last Vitals:  Vitals:   05/10/16 1035 05/10/16 1045  BP:  (!) 148/91  Pulse: (!) 106 90  Resp: (!) 26 19  Temp:      Last Pain:  Vitals:   05/10/16 1035  TempSrc:   PainSc: 8    Pain Goal: Patients Stated Pain Goal: 3 (05/10/16 0813)               Wyndi Northrup JR,JOHN Susann Givens

## 2016-05-10 NOTE — Anesthesia Preprocedure Evaluation (Signed)
Anesthesia Evaluation  Patient identified by MRN, date of birth, ID band Patient awake    Reviewed: Allergy & Precautions, NPO status , Patient's Chart, lab work & pertinent test results  History of Anesthesia Complications Negative for: history of anesthetic complications  Airway Mallampati: I  TM Distance: >3 FB Neck ROM: Full    Dental  (+) Teeth Intact,    Pulmonary neg pulmonary ROS,    Pulmonary exam normal        Cardiovascular negative cardio ROS Normal cardiovascular exam     Neuro/Psych  Headaches, negative psych ROS   GI/Hepatic Neg liver ROS, GERD  Controlled,  Endo/Other  diabetes, Gestational  Renal/GU negative Renal ROS  negative genitourinary   Musculoskeletal negative musculoskeletal ROS (+)   Abdominal (+) + obese,   Peds negative pediatric ROS (+)  Hematology negative hematology ROS (+)   Anesthesia Other Findings   Reproductive/Obstetrics negative OB ROS                            Anesthesia Physical Anesthesia Plan  ASA: II  Anesthesia Plan: General   Post-op Pain Management:    Induction: Intravenous  Airway Management Planned: Oral ETT  Additional Equipment:   Intra-op Plan:   Post-operative Plan: Extubation in OR  Informed Consent: I have reviewed the patients History and Physical, chart, labs and discussed the procedure including the risks, benefits and alternatives for the proposed anesthesia with the patient or authorized representative who has indicated his/her understanding and acceptance.   Dental advisory given  Plan Discussed with:   Anesthesia Plan Comments:         Anesthesia Quick Evaluation

## 2016-05-10 NOTE — Addendum Note (Signed)
Addendum  created 05/10/16 1055 by Shanon Payor, CRNA   Anesthesia Intra Flowsheets edited

## 2016-05-10 NOTE — Discharge Instructions (Signed)
Laparoscopic Tubal Ligation, Care After Refer to this sheet in the next few weeks. These instructions provide you with information about caring for yourself after your procedure. Your health care provider may also give you more specific instructions. Your treatment has been planned according to current medical practices, but problems sometimes occur. Call your health care provider if you have any problems or questions after your procedure. WHAT TO EXPECT AFTER THE PROCEDURE After your procedure, it is common to have:  Sore throat.  Soreness at the incision site.  Mild cramping.  Tiredness.  Mild nausea or vomiting.  Shoulder pain. HOME CARE INSTRUCTIONS  Rest for the remainder of the day.  Take medicines only as directed by your health care provider. These include over-the-counter medicines and prescription medicines. Do not take aspirin, which can cause bleeding.  Over the next few days, gradually return to your normal activities and your normal diet.  Avoid sexual intercourse for 2 weeks or as directed by your health care provider.  Do not use tampons, and do not douche.  Do not drive or operate heavy machinery while taking pain medicine.  Do not lift anything that is heavier than 5 lb (2.3 kg) for 2 weeks or as directed by your health care provider.  Do not take baths. Take showers only. Ask your health care provider when you can start taking baths.  Take your temperature twice each day and write it down.  Try to have help for your household needs for the first 7-10 days.  There are many different ways to close and cover an incision, including stitches (sutures), skin glue, and adhesive strips. Follow instructions from your health care provider about:  Incision care.  Bandage (dressing) changes and removal.  Incision closure removal.  Check your incision area every day for signs of infection. Watch for:  Redness, swelling, or pain.  Fluid, blood, or pus.  Keep  all follow-up visits as directed by your health care provider. SEEK MEDICAL CARE IF:  You have redness, swelling, or increasing pain in your incision area.  You have fluid, blood, or pus coming from your incision for longer than 1 day.  You notice a bad smell coming from your incision or your dressing.  The edges of your incision break open after the sutures have been removed.  Your pain does not decrease after 2-3 days.  You have a rash.  You repeatedly become dizzy or light-headed.  You have a reaction to your medicine.  Your pain medicine is not helping.  You are constipated. SEEK IMMEDIATE MEDICAL CARE IF:  You have a fever.  You faint.  You have increasing pain in your abdomen.  You have severe pain in one or both of your shoulders.  You have bleeding or drainage from your suture sites or your vagina after surgery.  You have shortness of breath or have difficulty breathing.  You have chest pain or leg pain.  You have ongoing nausea, vomiting, or diarrhea.   This information is not intended to replace advice given to you by your health care provider. Make sure you discuss any questions you have with your health care provider.   Document Released: 04/15/2005 Document Revised: 02/10/2015 Document Reviewed: 01/07/2012 Elsevier Interactive Patient Education 2016 Elsevier Inc.  DO NOT HAVE UNPROTECTED SEX UNTIL AFTER YOUR NEXT MENSTRUAL PERIOD

## 2016-05-10 NOTE — Op Note (Signed)
05/10/2016  9:51 AM  PATIENT:  Katrina Howard  35 y.o. female  PRE-OPERATIVE DIAGNOSIS:  DESIRE STERILIZATION  POST-OPERATIVE DIAGNOSIS:  DESIRED STERILIZATION  PROCEDURE:  Procedure(s): LAPAROSCOPIC TUBAL LIGATION (Bilateral)  SURGEON:  Surgeon(s) and Role:    * Allie Bossier, MD - Primary  ANESTHESIA:   general  EBL:  Total I/O In: -  Out: 2 [Blood:2]  BLOOD ADMINISTERED:none  DRAINS: none   LOCAL MEDICATIONS USED:  MARCAINE     SPECIMEN:  No Specimen  DISPOSITION OF SPECIMEN:  N/A  COUNTS:  YES  TOURNIQUET:  * No tourniquets in log *  DICTATION: .Dragon Dictation  PLAN OF CARE: Discharge to home after PACU  PATIENT DISPOSITION:  PACU - hemodynamically stable.   Delay start of Pharmacological VTE agent (>24hrs) due to surgical blood loss or risk of bleeding: not applicable   The risks, benefits, alternatives of surgery were explained understood, accepted. All questions were answered. She understands the failure rate of 1/200. She declines alternative forms of birth control. Her urine pregnancy test was negative. She was taken to the operating room and general anesthesia was applied without complication. She was placed in dorsal lithotomy position. Her abdomen and vagina were prepped and draped in the usual sterile fashion. A time out procedure was done. A bimanual exam revealed a normal, size and shape mobile uterus with nonenlarged adnexa. A Hulka manipulator was placed on the cervix. Gloves were changed and attention was turned to the abdomen. Approximately 10 mL of 0.5% Marcaine was used to infiltrate the subcutaneous tissue at the umbilicus. A vertical 1 cm incision was made. A Veres needle was placed in the pelvis. Low-flow CO2 was used to insufflate the abdomen to approximately 3 L. After good pneumoperitoneum was established, a 11 mm trocar was placed. Laparoscopy confirmed correct placement. She was placed in the Trendelenburg position. Patient abdominal pressure  was always less than 15. Her uterus ovaries and tubes appeared normal. There was no evidence of endometriosis. The oviducts were visually traced to their fimbriated end on each side. In the isthmic region of each oviduct, a Filshie clip was placed across the entire oviduct. There was no bleeding. The CO2 was allowed to escape from her abdomen. The umbilical fascia was closed with a figure of 0 Vicryl suture. No defects were palpable. The subcuticular closure was done with 4-0 Vicryl suture. The Hulka manipulator was removed. She was extubated and taken to recovery in stable condition. She tolerated the procedure well. The instrument, sponge, and needle counts were correct.

## 2016-05-10 NOTE — Anesthesia Procedure Notes (Signed)
Procedure Name: Intubation Date/Time: 05/10/2016 9:36 AM Performed by: Shanon Payor Pre-anesthesia Checklist: Patient identified, Emergency Drugs available, Suction available, Timeout performed and Patient being monitored Patient Re-evaluated:Patient Re-evaluated prior to inductionOxygen Delivery Method: Circle system utilized Preoxygenation: Pre-oxygenation with 100% oxygen Intubation Type: IV induction Ventilation: Mask ventilation without difficulty Laryngoscope Size: Mac and 3 Grade View: Grade I Tube type: Oral Tube size: 7.0 mm Number of attempts: 1 Airway Equipment and Method: Stylet Placement Confirmation: ETT inserted through vocal cords under direct vision,  positive ETCO2 and breath sounds checked- equal and bilateral Secured at: 21 cm Tube secured with: Tape Dental Injury: Teeth and Oropharynx as per pre-operative assessment

## 2016-05-10 NOTE — H&P (Signed)
Katrina Howard is an 34 y.o. female P2 here for permanent sterility in the form of a laparoscopic bilateral tubal ligation with Filsche clips.    Past Medical History:  Diagnosis Date  . Gestational diabetes 2017   Resolved with delivery 03/26/16  . History of gestational diabetes 03/26/2016   [ ] Needs 2 hour GTT Postpartum   . Influenza B 02/04/2016   Positive culture on 01/30/16 - treated with Tamiflu   . SVD (spontaneous vaginal delivery)    x 4    Past Surgical History:  Procedure Laterality Date  . WISDOM TOOTH EXTRACTION      Family History  Problem Relation Age of Onset  . Hypertension Mother   . Diabetes Father     Social History:  reports that she has never smoked. She has never used smokeless tobacco. She reports that she does not drink alcohol or use drugs.  Allergies:  Allergies  Allergen Reactions  . Morphine And Related Other (See Comments)    Reaction:  Hallucinations     No prescriptions prior to admission.    ROS  Height 5\' 2"  (1.575 m), weight 84.4 kg (186 lb), last menstrual period 04/18/2016, not currently breastfeeding. Physical Exam Heart- rrr Lungs- CTAB Abd- benign No results found for this or any previous visit (from the past 24 hour(s)).  No results found.  Assessment/Plan: Desire for permanent sterility- Plan for l/s Filsche clip application.  She understands the risks of surgery, including, but not to infection, bleeding, DVTs, damage to bowel, bladder, ureters. She wishes to proceed. She declines alternative forms of contraception. She understands that this is permanent.     Allie Bossier 05/10/2016, 7:41 AM

## 2016-05-10 NOTE — Transfer of Care (Signed)
Immediate Anesthesia Transfer of Care Note  Patient: Katrina Howard  Procedure(s) Performed: Procedure(s): LAPAROSCOPIC TUBAL LIGATION (Bilateral)  Patient Location: PACU  Anesthesia Type:General  Level of Consciousness: awake, alert  and oriented  Airway & Oxygen Therapy: Patient Spontanous Breathing and Patient connected to nasal cannula oxygen  Post-op Assessment: Report given to RN and Post -op Vital signs reviewed and stable  Post vital signs: Reviewed and stable  Last Vitals:  Vitals:   05/10/16 0813  BP: 115/88  Pulse: 81  Resp: 16  Temp: 36.4 C    Last Pain:  Vitals:   05/10/16 0813  TempSrc: Oral      Patients Stated Pain Goal: 3 (05/10/16 0813)  Complications: No apparent anesthesia complications

## 2016-05-12 ENCOUNTER — Encounter (HOSPITAL_COMMUNITY): Payer: Self-pay | Admitting: Obstetrics & Gynecology

## 2016-05-24 ENCOUNTER — Encounter: Payer: Self-pay | Admitting: *Deleted

## 2016-06-03 ENCOUNTER — Encounter: Payer: Self-pay | Admitting: *Deleted

## 2016-06-21 ENCOUNTER — Telehealth: Payer: Self-pay

## 2016-06-21 NOTE — Telephone Encounter (Signed)
Patient had a tubal ligation on 05/10/2016 and is still bleeding.She would like to know if this is normal?  Please call her back and address her concerns. [phone]

## 2016-06-22 ENCOUNTER — Ambulatory Visit (INDEPENDENT_AMBULATORY_CARE_PROVIDER_SITE_OTHER): Payer: Medicaid Other | Admitting: Family Medicine

## 2016-06-22 ENCOUNTER — Encounter: Payer: Self-pay | Admitting: Family Medicine

## 2016-06-22 VITALS — BP 138/89 | HR 70 | Wt 182.4 lb

## 2016-06-22 DIAGNOSIS — N939 Abnormal uterine and vaginal bleeding, unspecified: Secondary | ICD-10-CM

## 2016-06-22 MED ORDER — MEDROXYPROGESTERONE ACETATE 10 MG PO TABS: 20.0000 mg | ORAL_TABLET | Freq: Every day | ORAL | 2 refills | Status: DC

## 2016-06-22 NOTE — Telephone Encounter (Signed)
Patient has been scheduled for appointment today 06/22/2016

## 2016-06-22 NOTE — Progress Notes (Signed)
US scheduled for 06/27/16 @ 2pm.  Pt notified.

## 2016-06-22 NOTE — Progress Notes (Signed)
   Subjective:    Patient ID: Katrina Howard, female    DOB: 12/29/1980, 35 y.o.   MRN: [id number]  HPI Patient seen for vaginal bleeding.  Had son on 6/18 and had lochia until 3 days prior to BTL, which occurred on 8/1.  Started bleeding shortly after BTL and has had fairly continual bleeding until today (had 3 days in between with no bleeding).  Bleeding moderate with episodic gushes.  Denies pain, cramping, new sexual partner.  Denies sexual activity.    Review of Systems     Objective:   Physical Exam  Constitutional: She appears well-developed and well-nourished.  HENT:  Head: Normocephalic and atraumatic.  Cardiovascular: Normal rate.   Pulmonary/Chest: Effort normal. She has no wheezes. She has no rales.  Abdominal: Soft. She exhibits no distension and no mass. There is no tenderness. There is no rebound and no guarding.  Genitourinary:  Genitourinary Comments: declined  Skin: Skin is warm and dry.  Psychiatric: She has a normal mood and affect. Her behavior is normal. Judgment and thought content normal.      Assessment & Plan:  1. Abnormal uterine bleeding (AUB) Start provera 20mg  daily.  Will get US.  Pt has follow up with Dr Marice Potterove next week. - US Pelvis Complete; Future - US Transvaginal Non-OB; Future

## 2016-06-27 ENCOUNTER — Ambulatory Visit (HOSPITAL_COMMUNITY): Payer: Self-pay

## 2016-06-27 ENCOUNTER — Ambulatory Visit (HOSPITAL_COMMUNITY)
Admission: RE | Admit: 2016-06-27 | Discharge: 2016-06-27 | Disposition: A | Discharge status: Home / Self Care | Payer: Medicaid Other | Source: Ambulatory Visit | Attending: Family Medicine | Admitting: Family Medicine

## 2016-06-27 DIAGNOSIS — N939 Abnormal uterine and vaginal bleeding, unspecified: Secondary | ICD-10-CM | POA: Insufficient documentation

## 2016-06-30 ENCOUNTER — Encounter: Payer: Self-pay | Admitting: Obstetrics & Gynecology

## 2016-06-30 ENCOUNTER — Ambulatory Visit: Payer: Self-pay | Admitting: Obstetrics & Gynecology

## 2016-06-30 VITALS — BP 141/90 | HR 71 | Wt 184.0 lb

## 2016-06-30 DIAGNOSIS — N938 Other specified abnormal uterine and vaginal bleeding: Secondary | ICD-10-CM

## 2016-06-30 DIAGNOSIS — Z9889 Other specified postprocedural states: Secondary | ICD-10-CM

## 2016-06-30 NOTE — Progress Notes (Signed)
   Subjective:    Patient ID: Katrina Howard, female    DOB: 08/17/1981, 35 y.o.   MRN: [id number]  HPI 35 yo AA P2 here for a post op visit after a L/S BTL 05-10-16. She has no post op complications. She did have 4 weeks of bleeding and was seen in the office on 06-22-16 and given provera. This has stopped her bleeding.   Review of Systems     Objective:   Physical Exam [id number] Breathing, conversing, and ambulating normally Abd- obese, benign Umbilical incision healed well       Assessment & Plan:  Post op-doing well H/o DUB- reassurance given as her gyn u/s was "unremarkable" RTC 2 months for 2 hour GTT (had GDM in pregnancy)

## 2016-07-05 ENCOUNTER — Encounter: Payer: Self-pay | Admitting: *Deleted

## 2016-08-28 ENCOUNTER — Encounter (HOSPITAL_COMMUNITY): Payer: Self-pay | Admitting: Family Medicine

## 2016-08-28 ENCOUNTER — Ambulatory Visit (HOSPITAL_COMMUNITY)
Admission: EM | Admit: 2016-08-28 | Discharge: 2016-08-28 | Disposition: A | Discharge status: Home / Self Care | Payer: Worker's Compensation | Attending: Emergency Medicine | Admitting: Emergency Medicine

## 2016-08-28 ENCOUNTER — Ambulatory Visit (INDEPENDENT_AMBULATORY_CARE_PROVIDER_SITE_OTHER): Payer: Worker's Compensation

## 2016-08-28 DIAGNOSIS — S93491A Sprain of other ligament of right ankle, initial encounter: Secondary | ICD-10-CM | POA: Diagnosis not present

## 2016-08-28 NOTE — ED Triage Notes (Signed)
Pt sts that she was at work and slipped in some water today and twisted right ankle. Pt here for workers comp.

## 2016-08-28 NOTE — ED Provider Notes (Signed)
CSN: [id number]     Arrival date & time 08/28/16  1206 History   First MD Initiated Contact with Patient 08/28/16 1401     Chief Complaint  Patient presents with  . Ankle Pain   (Consider location/radiation/quality/duration/timing/severity/associated sxs/prior Treatment) 35 year old female was walking down the hall at work she slipped and fell and experienced minor discomfort to the lateral aspect of the right ankle. She states that she is able to bear weight and ambulate normally. She states is mild tenderness to the lateral aspect of the right ankle. She was advised to be evaluated because this occurred at work. Denies other injury. Denies injury to the head, neck, back, upper extremities, chest, abdomen or left lower extremity.      Past Medical History:  Diagnosis Date  . Gestational diabetes 2017   Resolved with delivery 03/26/16  . History of gestational diabetes 03/26/2016   [ ] Needs 2 hour GTT Postpartum   . Influenza B 02/04/2016   Positive culture on 01/30/16 - treated with Tamiflu   . SVD (spontaneous vaginal delivery)    x 4   Past Surgical History:  Procedure Laterality Date  . LAPAROSCOPIC TUBAL LIGATION Bilateral 05/10/2016   Procedure: LAPAROSCOPIC TUBAL LIGATION;  Surgeon: Allie BossierMyra C Dove, MD;  Location: [facility];  Service: Gynecology;  Laterality: Bilateral;  . WISDOM TOOTH EXTRACTION     Family History  Problem Relation Age of Onset  . Hypertension Mother   . Diabetes Father    Social History  Substance Use Topics  . Smoking status: Never Smoker  . Smokeless tobacco: Never Used  . Alcohol use No   OB History    Gravida Para Term Preterm AB Living   4 4 2 2  0 4   SAB TAB Ectopic Multiple Live Births   0 0 0 1 4     Review of Systems  Constitutional: Negative for activity change, chills and fever.  HENT: Negative.   Respiratory: Negative.   Cardiovascular: Negative.   Musculoskeletal:       As per HPI  Skin: Negative for color change, pallor and rash.   Neurological: Negative.     Allergies  Morphine and related  Home Medications   Prior to Admission medications   Medication Sig Start Date End Date Taking? Authorizing Provider  ibuprofen (ADVIL,MOTRIN) 600 MG tablet Take 1 tablet (600 mg total) by mouth every 6 (six) hours. 03/28/16   Marlis EdelsonWalidah N Karim, CNM  medroxyPROGESTERone (PROVERA) 10 MG tablet Take 2 tablets (20 mg total) by mouth daily. 06/22/16   Levie HeritageJacob J Stinson, DO   Meds Ordered and Administered this Visit  Medications - No data to display  BP 140/91   Pulse 67   Temp 98.4 F (36.9 C)   Resp 18   LMP 08/28/2016   SpO2 100%  No data found.   Physical Exam  Constitutional: She is oriented to person, place, and time. She appears well-developed and well-nourished. No distress.  HENT:  Head: Normocephalic and atraumatic.  Eyes: EOM are normal.  Neck: Normal range of motion. Neck supple.  Cardiovascular: Normal rate.   Pulmonary/Chest: Effort normal.  Musculoskeletal: She exhibits no edema.  No deformity or swelling to the right ankle. No bony tenderness. There is minor tenderness to the soft tissues just distal to the lateral fibula. Patient demonstrates full range of motion with plantarflexion, dorsiflexion, rotation and inversion, eversion deviations. Pedal pulse 2+. No discoloration or deformities.  Neurological: She is alert and oriented to person, place,  and time. No cranial nerve deficit.  Skin: Skin is warm and dry. Capillary refill takes less than 2 seconds.  Psychiatric: She has a normal mood and affect.  Nursing note and vitals reviewed.   Urgent Care Course   Clinical Course     Procedures (including critical care time)  Labs Review Labs Reviewed - No data to display  Imaging Review Dg Ankle Complete Right  Result Date: 08/28/2016 CLINICAL DATA:  Initial encounter for Patient states that she slipped on some water on floor at work this morning injuring her rt ankle. Pain along lateral side of  ankle. EXAM: RIGHT ANKLE - COMPLETE 3+ VIEW COMPARISON:  None. FINDINGS: Minimal lateral malleolar soft tissue swelling. No acute fracture or dislocation. Base of fifth metatarsal and talar dome intact. IMPRESSION: No acute osseous abnormality. Electronically Signed   By: Jeronimo GreavesKyle  Talbot M.D.   On: 08/28/2016 14:28     Visual Acuity Review  Right Eye Distance:   Left Eye Distance:   Bilateral Distance:    Right Eye Near:   Left Eye Near:    Bilateral Near:         MDM   1. Sprain of posterior talofibular ligament of right ankle, initial encounter    Wear the ASO for the next 3-4 days. May remove it for bathing but where most of the time. Ice, elevation and rest. If he found that there is swelling and pain while you are working and walking that means that you  need to stop and elevate her ankle. ASO   Hayden Rasmussenavid Yannis Gumbs, NP 08/28/16 1452

## 2016-08-28 NOTE — Discharge Instructions (Signed)
Wear the ASO for the next 3-4 days. May remove it for bathing but where most of the time. Ice, elevation and rest. If he found that there is swelling and pain while you are working and walking that means that you  need to stop and elevate her ankle.

## 2016-12-05 ENCOUNTER — Encounter (HOSPITAL_COMMUNITY): Payer: Self-pay

## 2016-12-05 ENCOUNTER — Emergency Department (HOSPITAL_BASED_OUTPATIENT_CLINIC_OR_DEPARTMENT_OTHER)
Admission: EM | Admit: 2016-12-05 | Discharge: 2016-12-05 | Disposition: A | Discharge status: Home / Self Care | Payer: Medicaid Other | Attending: Emergency Medicine | Admitting: Emergency Medicine

## 2016-12-05 ENCOUNTER — Encounter (HOSPITAL_BASED_OUTPATIENT_CLINIC_OR_DEPARTMENT_OTHER): Payer: Self-pay | Admitting: *Deleted

## 2016-12-05 DIAGNOSIS — J029 Acute pharyngitis, unspecified: Secondary | ICD-10-CM | POA: Insufficient documentation

## 2016-12-05 DIAGNOSIS — B370 Candidal stomatitis: Secondary | ICD-10-CM

## 2016-12-05 LAB — RAPID STREP SCREEN (MED CTR MEBANE ONLY): Streptococcus, Group A Screen (Direct): NEGATIVE

## 2016-12-05 MED ORDER — CEPASTAT 14.5 MG MT LOZG: 1.0000 | LOZENGE | OROMUCOSAL | 0 refills | Status: DC | PRN

## 2016-12-05 MED ORDER — NYSTATIN 100000 UNIT/ML MT SUSP: 500000.0000 [IU] | Freq: Four times a day (QID) | OROMUCOSAL | 0 refills | Status: DC

## 2016-12-05 MED FILL — NYSTATIN 100,000 UNITS/ML S: 100000 | 3 days supply | Qty: 60 | Fill #0

## 2016-12-05 NOTE — ED Notes (Signed)
Previous chest pain note documented in error.

## 2016-12-05 NOTE — ED Notes (Signed)
Pt provided with some crackers and soda.

## 2016-12-05 NOTE — ED Provider Notes (Signed)
[facility] Note   CSN: [id number] Arrival date & time: 12/05/16  1325     History   Chief Complaint Chief Complaint  Patient presents with  . Sore Throat    HPI Katrina Howard is a 36 y.o. female.  HPI  36 year old female presents with sore throat for 2-3 days. She describes pain at the roof of her mouth and now in her oropharynx. She has also noticed whiteness on her tongue. No fevers. No cough or shortness of breath. No voice change. Swallowing is very painful. She has a history of gestational diabetes but no diabetes since. No known history of HIV. No vomiting. She tried Tylenol and ibuprofen with no relief.  Past Medical History:  Diagnosis Date  . Gestational diabetes 2017   Resolved with delivery 03/26/16  . History of gestational diabetes 03/26/2016   [ ] Needs 2 hour GTT Postpartum   . Influenza B 02/04/2016   Positive culture on 01/30/16 - treated with Tamiflu   . SVD (spontaneous vaginal delivery)    x 4    Patient Active Problem List   Diagnosis Date Noted  . History of gestational diabetes 03/26/2016  . History of placenta abruption 01/07/2016  . GERD (gastroesophageal reflux disease) 01/07/2016  . History of preterm delivery 09/29/2015  . History of eclampsia 09/29/2015  . Migraine 09/29/2015    Past Surgical History:  Procedure Laterality Date  . LAPAROSCOPIC TUBAL LIGATION Bilateral 05/10/2016   Procedure: LAPAROSCOPIC TUBAL LIGATION;  Surgeon: Allie Bossier, MD;  Location: [facility];  Service: Gynecology;  Laterality: Bilateral;  . WISDOM TOOTH EXTRACTION      OB History    Gravida Para Term Preterm AB Living   4 4 2 2  0 4   SAB TAB Ectopic Multiple Live Births   0 0 0 1 4       Home Medications    Prior to Admission medications   Medication Sig Start Date End Date Taking? Authorizing Provider  ibuprofen (ADVIL,MOTRIN) 600 MG tablet Take 1 tablet (600 mg total) by mouth every 6 (six) hours. 03/28/16  Yes Marlis Edelson, CNM    medroxyPROGESTERone (PROVERA) 10 MG tablet Take 2 tablets (20 mg total) by mouth daily. 06/22/16   Levie Heritage, DO  nystatin (MYCOSTATIN) 100000 UNIT/ML suspension Take 5 mLs (500,000 Units total) by mouth 4 (four) times daily. Swish (in mouth for several minutes, as long as possible) and swallow 12/05/16   Pricilla Loveless, MD  phenol-menthol (CEPASTAT) 14.5 MG lozenge Place 1 lozenge inside cheek as needed for sore throat. 12/05/16   Pricilla Loveless, MD    Family History Family History  Problem Relation Age of Onset  . Hypertension Mother   . Diabetes Father     Social History Social History  Substance Use Topics  . Smoking status: Never Smoker  . Smokeless tobacco: Never Used  . Alcohol use No     Allergies   Morphine and related   Review of Systems Review of Systems  Constitutional: Negative for fever.  HENT: Positive for sore throat. Negative for voice change.   Respiratory: Negative for cough and shortness of breath.   Cardiovascular: Negative for chest pain.  Gastrointestinal: Negative for nausea and vomiting.  Musculoskeletal: Negative for neck pain and neck stiffness.  All other systems reviewed and are negative.    Physical Exam Updated Vital Signs BP 126/90   Pulse 76   Temp 98.8 F (37.1 C) (Oral)   Resp 20  Ht 5\' 5"  (1.651 m)   Wt 174 lb (78.9 kg)   LMP 12/03/2016   SpO2 100%   BMI 28.96 kg/m   Physical Exam  Constitutional: She is oriented to person, place, and time. She appears well-developed and well-nourished.  HENT:  Head: Normocephalic and atraumatic.  Right Ear: External ear normal.  Left Ear: External ear normal.  Nose: Nose normal.  Mouth/Throat: Uvula is midline. No uvula swelling. No oropharyngeal exudate, posterior oropharyngeal edema or tonsillar abscesses.    Mildly red tonsils bilaterally. 1+. No PTA.  Eyes: Right eye exhibits no discharge. Left eye exhibits no discharge.  Neck: Normal range of motion. Neck supple.   Cardiovascular: Normal rate, regular rhythm and normal heart sounds.   Pulmonary/Chest: Effort normal and breath sounds normal. No stridor.  Abdominal: Soft. There is no tenderness.  Lymphadenopathy:    She has no cervical adenopathy.  Neurological: She is alert and oriented to person, place, and time.  Skin: Skin is warm and dry.  Nursing note and vitals reviewed.    ED Treatments / Results  Labs (all labs ordered are listed, but only abnormal results are displayed) Labs Reviewed  RAPID STREP SCREEN (NOT AT [facility])  CULTURE, GROUP A STREP ([facility])  HIV ANTIBODY (ROUTINE TESTING)  CBG MONITORING, ED    EKG  EKG Interpretation None       Radiology No results found.  Procedures Procedures (including critical care time)  Medications Ordered in ED Medications - No data to display   Initial Impression / Assessment and Plan / ED Course  I have reviewed the triage vital signs and the nursing notes.  Pertinent labs & imaging results that were available during my care of the patient were reviewed by me and considered in my medical decision making (see chart for details).     The only significant findings are the thrush in the tongue. There is some consideration that she could have esophageal candidiasis given some sore throat and painful swallowing been having this much less likely as it seems restricted to her tongue. She will be given nystatin swish and swallow. No indication of a bacterial illness. She overall appears well. Her glucose did not cross into the computer but was 66. HIV test pending. Otherwise likely viral illness. Follow-up closely with PCP. Discussed return per cautions.  Final Clinical Impressions(s) / ED Diagnoses   Final diagnoses:  Acute pharyngitis, unspecified etiology  Thrush, oral    New Prescriptions Discharge Medication List as of 12/05/2016  2:57 PM    START taking these medications   Details  nystatin (MYCOSTATIN) 100000 UNIT/ML suspension  Take 5 mLs (500,000 Units total) by mouth 4 (four) times daily. Swish (in mouth for several minutes, as long as possible) and swallow, Starting Mon 12/05/2016, Print    phenol-menthol (CEPASTAT) 14.5 MG lozenge Place 1 lozenge inside cheek as needed for sore throat., Starting Mon 12/05/2016, Print         Pricilla LovelessScott Ajia Chadderdon, MD 12/05/16 [phone]

## 2016-12-05 NOTE — ED Triage Notes (Signed)
Sore throat x 2 days. Soreness to the roof of her mouth.

## 2016-12-06 LAB — HIV ANTIBODY (ROUTINE TESTING W REFLEX): HIV Screen 4th Generation wRfx: NONREACTIVE

## 2016-12-06 LAB — CBG MONITORING, ED: GLUCOSE-CAPILLARY: 66 mg/dL (ref 65–99)

## 2016-12-08 LAB — CULTURE, GROUP A STREP (THRC)

## 2017-02-01 ENCOUNTER — Inpatient Hospital Stay (HOSPITAL_COMMUNITY)
Admission: AD | Admit: 2017-02-01 | Discharge: 2017-02-01 | Disposition: A | Discharge status: Home / Self Care | Payer: Medicaid Other | Source: Ambulatory Visit | Attending: Obstetrics and Gynecology | Admitting: Obstetrics and Gynecology

## 2017-02-01 ENCOUNTER — Encounter (HOSPITAL_COMMUNITY): Payer: Self-pay | Admitting: *Deleted

## 2017-02-01 DIAGNOSIS — N898 Other specified noninflammatory disorders of vagina: Secondary | ICD-10-CM | POA: Diagnosis present

## 2017-02-01 DIAGNOSIS — A5901 Trichomonal vulvovaginitis: Secondary | ICD-10-CM | POA: Diagnosis not present

## 2017-02-01 HISTORY — DX: Unspecified abnormal cytological findings in specimens from vagina: R87.629

## 2017-02-01 LAB — URINALYSIS, ROUTINE W REFLEX MICROSCOPIC
BILIRUBIN URINE: NEGATIVE
GLUCOSE, UA: NEGATIVE mg/dL
HGB URINE DIPSTICK: NEGATIVE
Ketones, ur: NEGATIVE mg/dL
NITRITE: NEGATIVE
PROTEIN: NEGATIVE mg/dL
Specific Gravity, Urine: 1.015 (ref 1.005–1.030)
pH: 5 (ref 5.0–8.0)

## 2017-02-01 LAB — WET PREP, GENITAL
Clue Cells Wet Prep HPF POC: NONE SEEN
SPERM: NONE SEEN
YEAST WET PREP: NONE SEEN

## 2017-02-01 LAB — POCT PREGNANCY, URINE: Preg Test, Ur: NEGATIVE

## 2017-02-01 MED ORDER — METRONIDAZOLE 500 MG PO TABS
2000.0000 mg | ORAL_TABLET | Freq: Once | ORAL | Status: AC
  Administered 2017-02-01: 2000 mg via ORAL
  Filled 2017-02-01: qty 4

## 2017-02-01 NOTE — Discharge Instructions (Signed)

## 2017-02-01 NOTE — MAU Note (Signed)
Been having a d/c. Has gotten strong, starting to itch and the odor is bad.  Noted for about 3 days, seems to have started after cycle.  Now is having some cramping

## 2017-02-01 NOTE — MAU Provider Note (Signed)
History     CSN: [id number]  Arrival date and time: 02/01/17 0707  First Provider Initiated Contact with Patient 02/01/17 [phone]      Chief Complaint  Patient presents with  . Vaginal Discharge   HPI Jannelle Notaro is a 36 y.o. non pregnant female who presents for vaginal discharge. Symptoms began 3 days ago after the end of her menses. Reports thin yellow discharge with foul odor. States she gets BV "all the time" (last time being a year ago) and that her current symptoms are just like when she has BV. Associated symptoms include abdominal cramping. Rates pain 5/10. Intermittent cramping. Has not treated. Denies n/v, dysuria, vaginal bleeding, fever, dyspareunia, or postcoital bleeding.  Is sexually active with 1 partner x "several years".  Past Medical History:  Diagnosis Date  . History of gestational diabetes 03/26/2016   Needs 2 hour GTT Postpartum   . Vaginal Pap smear, abnormal    due to std, no problems since    Past Surgical History:  Procedure Laterality Date  . LAPAROSCOPIC TUBAL LIGATION Bilateral 05/10/2016   Procedure: LAPAROSCOPIC TUBAL LIGATION;  Surgeon: Allie Bossier, MD;  Location: [facility];  Service: Gynecology;  Laterality: Bilateral;  . WISDOM TOOTH EXTRACTION      Family History  Problem Relation Age of Onset  . Hypertension Mother   . Diabetes Father   . Kidney disease Maternal Grandmother   . Cancer Maternal Grandfather     colon    Social History  Substance Use Topics  . Smoking status: Never Smoker  . Smokeless tobacco: Never Used  . Alcohol use No    Allergies:  Allergies  Allergen Reactions  . Morphine And Related Other (See Comments)    Reaction:  Hallucinations     Prescriptions Prior to Admission  Medication Sig Dispense Refill Last Dose  . ibuprofen (ADVIL,MOTRIN) 600 MG tablet Take 1 tablet (600 mg total) by mouth every 6 (six) hours. 30 tablet 0 Taking  . medroxyPROGESTERone (PROVERA) 10 MG tablet Take 2 tablets (20 mg total) by  mouth daily. 30 tablet 2 Taking  . nystatin (MYCOSTATIN) 100000 UNIT/ML suspension Take 5 mLs (500,000 Units total) by mouth 4 (four) times daily. Swish (in mouth for several minutes, as long as possible) and swallow 60 mL 0   . phenol-menthol (CEPASTAT) 14.5 MG lozenge Place 1 lozenge inside cheek as needed for sore throat. 18 tablet 0     Review of Systems  Constitutional: Negative.   Gastrointestinal: Positive for abdominal pain. Negative for constipation, diarrhea, nausea and vomiting.  Genitourinary: Positive for vaginal discharge. Negative for difficulty urinating, dyspareunia, dysuria, vaginal bleeding and vaginal pain.   Physical Exam   Blood pressure 117/87, pulse 75, temperature 98.4 F (36.9 C), temperature source Oral, resp. rate 18, weight 175 lb 8 oz (79.6 kg), last menstrual period 01/23/2017, SpO2 100 %, not currently breastfeeding.  Physical Exam  Nursing note and vitals reviewed. Constitutional: She is oriented to person, place, and time. She appears well-developed and well-nourished. No distress.  HENT:  Head: Normocephalic and atraumatic.  Eyes: Conjunctivae are normal. Right eye exhibits no discharge. Left eye exhibits no discharge. No scleral icterus.  Neck: Normal range of motion.  Respiratory: Effort normal. No respiratory distress.  GI: Soft. There is no tenderness.  Neurological: She is alert and oriented to person, place, and time.  Skin: Skin is warm and dry. She is not diaphoretic.  Psychiatric: She has a normal mood and affect. Her behavior  is normal. Judgment and thought content normal.    MAU Course  Procedures Results for orders placed or performed during the hospital encounter of 02/01/17 (from the past 24 hour(s))  Urinalysis, Routine w reflex microscopic     Status: Abnormal   Collection Time: 02/01/17  7:39 AM  Result Value Ref Range   Color, Urine YELLOW YELLOW   APPearance CLEAR CLEAR   Specific Gravity, Urine 1.015 1.005 - 1.030   pH 5.0  5.0 - 8.0   Glucose, UA NEGATIVE NEGATIVE mg/dL   Hgb urine dipstick NEGATIVE NEGATIVE   Bilirubin Urine NEGATIVE NEGATIVE   Ketones, ur NEGATIVE NEGATIVE mg/dL   Protein, ur NEGATIVE NEGATIVE mg/dL   Nitrite NEGATIVE NEGATIVE   Leukocytes, UA SMALL (A) NEGATIVE   RBC / HPF 0-5 0 - 5 RBC/hpf   WBC, UA 0-5 0 - 5 WBC/hpf   Bacteria, UA RARE (A) NONE SEEN   Squamous Epithelial / LPF 0-5 (A) NONE SEEN   Mucous PRESENT   Pregnancy, urine POC     Status: None   Collection Time: 02/01/17  7:52 AM  Result Value Ref Range   Preg Test, Ur NEGATIVE NEGATIVE  Wet prep, genital     Status: Abnormal   Collection Time: 02/01/17  8:00 AM  Result Value Ref Range   Yeast Wet Prep HPF POC NONE SEEN NONE SEEN   Trich, Wet Prep PRESENT (A) NONE SEEN   Clue Cells Wet Prep HPF POC NONE SEEN NONE SEEN   WBC, Wet Prep HPF POC FEW (A) NONE SEEN   Sperm NONE SEEN     [facility] UPT negative GC/CT & wet prep collected Wet prep + trich -- discussed results with patient. Flagyl 2gm PO given in MAU  Assessment and Plan  A: 1. Trichomonas vaginitis    P; Discharge home No intercourse x 1 week Expedited partner rx & info sheet given GC/CT pending Discussed reasons to return  Judeth Horn 02/01/2017, 8:07 AM

## 2017-02-01 NOTE — MAU Note (Signed)
Not in lobby

## 2017-02-02 LAB — GC/CHLAMYDIA PROBE AMP (~~LOC~~) NOT AT ARMC
Chlamydia: NEGATIVE
NEISSERIA GONORRHEA: NEGATIVE

## 2017-08-16 ENCOUNTER — Encounter: Payer: Self-pay | Admitting: Advanced Practice Midwife

## 2017-08-16 ENCOUNTER — Ambulatory Visit: Payer: Medicaid Other | Admitting: Advanced Practice Midwife

## 2017-08-16 ENCOUNTER — Other Ambulatory Visit (HOSPITAL_COMMUNITY)
Admission: RE | Admit: 2017-08-16 | Discharge: 2017-08-16 | Disposition: A | Discharge status: Home / Self Care | Payer: Medicaid Other | Source: Ambulatory Visit | Attending: Advanced Practice Midwife | Admitting: Advanced Practice Midwife

## 2017-08-16 VITALS — BP 123/82 | HR 71 | Ht 64.0 in | Wt 177.8 lb

## 2017-08-16 DIAGNOSIS — N939 Abnormal uterine and vaginal bleeding, unspecified: Secondary | ICD-10-CM

## 2017-08-16 DIAGNOSIS — Z01419 Encounter for gynecological examination (general) (routine) without abnormal findings: Secondary | ICD-10-CM | POA: Diagnosis not present

## 2017-08-16 DIAGNOSIS — Z202 Contact with and (suspected) exposure to infections with a predominantly sexual mode of transmission: Secondary | ICD-10-CM

## 2017-08-16 DIAGNOSIS — Z113 Encounter for screening for infections with a predominantly sexual mode of transmission: Secondary | ICD-10-CM

## 2017-08-16 DIAGNOSIS — Z0000 Encounter for general adult medical examination without abnormal findings: Secondary | ICD-10-CM | POA: Diagnosis not present

## 2017-08-16 MED ORDER — NORGESTIMATE-ETH ESTRADIOL 0.25-35 MG-MCG PO TABS
1.0000 | ORAL_TABLET | Freq: Every day | ORAL | 12 refills | Status: AC
Start: 2017-08-16 — End: ?

## 2017-08-16 NOTE — Patient Instructions (Signed)
Abnormal Uterine Bleeding Abnormal uterine bleeding can affect women at various stages in life, including teenagers, women in their reproductive years, pregnant women, and women who have reached menopause. Several kinds of uterine bleeding are considered abnormal, including:  Bleeding or spotting between periods.  Bleeding after sexual intercourse.  Bleeding that is heavier or more than normal.  Periods that last longer than usual.  Bleeding after menopause. Many cases of abnormal uterine bleeding are minor and simple to treat, while others are more serious. Any type of abnormal bleeding should be evaluated by your health care provider. Treatment will depend on the cause of the bleeding. Follow these instructions at home: Monitor your condition for any changes. The following actions may help to alleviate any discomfort you are experiencing:  Avoid the use of tampons and douches as directed by your health care provider.  Change your pads frequently. You should get regular pelvic exams and Pap tests. Keep all follow-up appointments for diagnostic tests as directed by your health care provider. Contact a health care provider if:  Your bleeding lasts more than 1 week.  You feel dizzy at times. Get help right away if:  You pass out.  You are changing pads every 15 to 30 minutes.  You have abdominal pain.  You have a fever.  You become sweaty or weak.  You are passing large blood clots from the vagina.  You start to feel nauseous and vomit. This information is not intended to replace advice given to you by your health care provider. Make sure you discuss any questions you have with your health care provider. Document Released: 09/26/2005 Document Revised: 03/09/2016 Document Reviewed: 04/25/2013 Elsevier Interactive Patient Education  2017 Elsevier Inc.  

## 2017-08-16 NOTE — Progress Notes (Signed)
Patient and/or legal guardian verbally declined to meet with Behavioral Health Clinician about presenting concerns. I informed her she could make an appointment to see her another day.
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
## 2017-08-17 LAB — HEPATITIS B SURFACE ANTIGEN: Hepatitis B Surface Ag: NEGATIVE

## 2017-08-17 LAB — HIV ANTIBODY (ROUTINE TESTING W REFLEX): HIV Screen 4th Generation wRfx: NONREACTIVE

## 2017-08-17 LAB — RPR: RPR Ser Ql: NONREACTIVE

## 2017-08-18 LAB — CYTOLOGY - PAP
Bacterial vaginitis: POSITIVE — AB
CANDIDA VAGINITIS: NEGATIVE
Chlamydia: NEGATIVE
Diagnosis: NEGATIVE
HPV: NOT DETECTED
NEISSERIA GONORRHEA: NEGATIVE
TRICH (WINDOWPATH): NEGATIVE

## 2017-08-21 ENCOUNTER — Other Ambulatory Visit: Payer: Self-pay

## 2017-08-21 ENCOUNTER — Emergency Department (HOSPITAL_BASED_OUTPATIENT_CLINIC_OR_DEPARTMENT_OTHER): Payer: Medicaid Other

## 2017-08-21 ENCOUNTER — Emergency Department (HOSPITAL_BASED_OUTPATIENT_CLINIC_OR_DEPARTMENT_OTHER)
Admission: EM | Admit: 2017-08-21 | Discharge: 2017-08-21 | Disposition: A | Discharge status: Home / Self Care | Payer: Medicaid Other | Attending: Emergency Medicine | Admitting: Emergency Medicine

## 2017-08-21 ENCOUNTER — Encounter (HOSPITAL_BASED_OUTPATIENT_CLINIC_OR_DEPARTMENT_OTHER): Payer: Self-pay

## 2017-08-21 DIAGNOSIS — Z79899 Other long term (current) drug therapy: Secondary | ICD-10-CM | POA: Diagnosis not present

## 2017-08-21 DIAGNOSIS — R42 Dizziness and giddiness: Secondary | ICD-10-CM | POA: Insufficient documentation

## 2017-08-21 LAB — CBC WITH DIFFERENTIAL/PLATELET
BASOS ABS: 0 10*3/uL (ref 0.0–0.1)
BASOS PCT: 0 %
EOS PCT: 3 %
Eosinophils Absolute: 0.2 10*3/uL (ref 0.0–0.7)
HCT: 37.8 % (ref 36.0–46.0)
Hemoglobin: 12.5 g/dL (ref 12.0–15.0)
Lymphocytes Relative: 50 %
Lymphs Abs: 3 10*3/uL (ref 0.7–4.0)
MCH: 26.4 pg (ref 26.0–34.0)
MCHC: 33.1 g/dL (ref 30.0–36.0)
MCV: 79.9 fL (ref 78.0–100.0)
MONO ABS: 0.5 10*3/uL (ref 0.1–1.0)
Monocytes Relative: 9 %
Neutro Abs: 2.2 10*3/uL (ref 1.7–7.7)
Neutrophils Relative %: 38 %
PLATELETS: 241 10*3/uL (ref 150–400)
RBC: 4.73 MIL/uL (ref 3.87–5.11)
RDW: 14.2 % (ref 11.5–15.5)
WBC: 5.9 10*3/uL (ref 4.0–10.5)

## 2017-08-21 LAB — URINALYSIS, ROUTINE W REFLEX MICROSCOPIC
Bilirubin Urine: NEGATIVE
GLUCOSE, UA: NEGATIVE mg/dL
KETONES UR: NEGATIVE mg/dL
LEUKOCYTES UA: NEGATIVE
Nitrite: NEGATIVE
PROTEIN: NEGATIVE mg/dL
Specific Gravity, Urine: 1.02 (ref 1.005–1.030)
pH: 7 (ref 5.0–8.0)

## 2017-08-21 LAB — PREGNANCY, URINE: PREG TEST UR: NEGATIVE

## 2017-08-21 LAB — BASIC METABOLIC PANEL
ANION GAP: 4 — AB (ref 5–15)
BUN: 9 mg/dL (ref 6–20)
CO2: 29 mmol/L (ref 22–32)
Calcium: 8.7 mg/dL — ABNORMAL LOW (ref 8.9–10.3)
Chloride: 105 mmol/L (ref 101–111)
Creatinine, Ser: 0.83 mg/dL (ref 0.44–1.00)
GLUCOSE: 93 mg/dL (ref 65–99)
Potassium: 3.6 mmol/L (ref 3.5–5.1)
SODIUM: 138 mmol/L (ref 135–145)

## 2017-08-21 LAB — URINALYSIS, MICROSCOPIC (REFLEX)

## 2017-08-21 LAB — CBG MONITORING, ED: GLUCOSE-CAPILLARY: 113 mg/dL — AB (ref 65–99)

## 2017-08-21 MED ORDER — DEXAMETHASONE SODIUM PHOSPHATE 10 MG/ML IJ SOLN
10.0000 mg | Freq: Once | INTRAMUSCULAR | Status: AC
  Administered 2017-08-21: 10 mg via INTRAVENOUS
  Filled 2017-08-21: qty 1

## 2017-08-21 MED ORDER — SODIUM CHLORIDE 0.9 % IV BOLUS (SEPSIS)
1000.0000 mL | Freq: Once | INTRAVENOUS | Status: AC
  Administered 2017-08-21: 1000 mL via INTRAVENOUS

## 2017-08-21 MED ORDER — MECLIZINE HCL 25 MG PO TABS
25.0000 mg | ORAL_TABLET | Freq: Three times a day (TID) | ORAL | 0 refills | Status: AC | PRN
Start: 2017-08-21 — End: ?

## 2017-08-21 MED ORDER — PROMETHAZINE HCL 25 MG/ML IJ SOLN
25.0000 mg | Freq: Once | INTRAMUSCULAR | Status: AC
  Administered 2017-08-21: 25 mg via INTRAVENOUS
  Filled 2017-08-21: qty 1

## 2017-08-21 MED ORDER — DIAZEPAM 5 MG PO TABS
5.0000 mg | ORAL_TABLET | Freq: Three times a day (TID) | ORAL | 0 refills | Status: AC | PRN
Start: 2017-08-21 — End: ?

## 2017-08-21 MED ORDER — KETOROLAC TROMETHAMINE 30 MG/ML IJ SOLN
30.0000 mg | Freq: Once | INTRAMUSCULAR | Status: AC
  Administered 2017-08-21: 30 mg via INTRAVENOUS
  Filled 2017-08-21: qty 1

## 2017-08-21 MED ORDER — MECLIZINE HCL 25 MG PO TABS
25.0000 mg | ORAL_TABLET | Freq: Once | ORAL | Status: AC
  Administered 2017-08-21: 25 mg via ORAL
  Filled 2017-08-21: qty 1

## 2017-08-21 MED ORDER — DIAZEPAM 5 MG/ML IJ SOLN
5.0000 mg | Freq: Once | INTRAMUSCULAR | Status: AC
  Administered 2017-08-21: 5 mg via INTRAVENOUS
  Filled 2017-08-21: qty 2

## 2017-08-21 NOTE — Discharge Instructions (Signed)
Please take medications as needed for vertigo. Your blood work and CT head are reassuring.  Return without fail for worsening symptoms, including intractable vomiting, difficulty walking, double vision, speech changes, numbness weakness of the arms or legs, or any other symptoms concerning to you

## 2017-08-21 NOTE — ED Notes (Signed)
ED Provider at bedside. 

## 2017-08-21 NOTE — ED Triage Notes (Signed)
Pt states she woke 7am dizzy, left hand numbness-NAD-presents to triage in w/c

## 2017-08-21 NOTE — ED Provider Notes (Signed)
[facility] EMERGENCY DEPARTMENT Provider Note   CSN: [id number] Arrival date & time: 08/21/17  1648     History   Chief Complaint Chief Complaint  Patient presents with  . Dizziness    HPI Katrina Howard is a 36 y.o. female.  The history is provided by the patient.  Dizziness  Quality:  Head spinning and imbalance Severity:  Severe Onset quality:  Sudden Duration:  1 day Timing:  Constant Progression:  Worsening Chronicity:  New Context: head movement, physical activity and standing up   Context: not with loss of consciousness   Relieved by:  Being still and closing eyes Worsened by:  Eye movement, movement and sitting upright Ineffective treatments:  None tried Associated symptoms: headaches and vomiting   Associated symptoms: no chest pain, no diarrhea, no shortness of breath, no syncope, no vision changes and no weakness    36 year old female who presents with headache and dizziness. Symptoms began this morning when she woke up.  She reports a history of migraine headaches, on describes frontal headache, photophobia, nausea and vomiting which are typical for her migraines but states that the dizziness is not.  States that she feels that she is spinning, has difficulty ambulating, and feels unsteady.  States that any movement makes her symptoms worse, if she lay still and close her eyes her symptoms are better.  No recent fevers, upper respiratory infectious symptoms, chest pain, difficulty breathing, cough, focal numbness or weakness, double vision, or speech changes.  Past Medical History:  Diagnosis Date  . History of gestational diabetes 03/26/2016   [ ] Needs 2 hour GTT Postpartum   . Trichomoniasis   . Vaginal Pap smear, abnormal    due to std, no problems since    Patient Active Problem List   Diagnosis Date Noted  . History of gestational diabetes 03/26/2016  . History of placenta abruption 01/07/2016  . GERD (gastroesophageal reflux disease)  01/07/2016  . History of preterm delivery 09/29/2015  . History of eclampsia 09/29/2015  . Migraine 09/29/2015    Past Surgical History:  Procedure Laterality Date  . WISDOM TOOTH EXTRACTION      OB History    Gravida Para Term Preterm AB Living   4 4 2 2  0 3   SAB TAB Ectopic Multiple Live Births   0 0 0 0 3       Home Medications    Prior to Admission medications   Medication Sig Start Date End Date Taking? Authorizing Provider  diazepam (VALIUM) 5 MG tablet Take 1 tablet (5 mg total) every 8 (eight) hours as needed by mouth (vertigo, dizziness). 08/21/17   Lavera GuiseLiu, Shawnia Vizcarrondo Duo, MD  ibuprofen (ADVIL,MOTRIN) 600 MG tablet Take 1 tablet (600 mg total) by mouth every 6 (six) hours. 03/28/16   Marlis EdelsonKarim, Walidah N, CNM  meclizine (ANTIVERT) 25 MG tablet Take 1 tablet (25 mg total) 3 (three) times daily as needed by mouth for dizziness. 08/21/17   Lavera GuiseLiu, Nyellie Yetter Duo, MD  norgestimate-ethinyl estradiol (ORTHO-CYCLEN,SPRINTEC,PREVIFEM) 0.25-35 MG-MCG tablet Take 1 tablet daily by mouth. 08/16/17   Dorathy KinsmanSmith, [place], CNM    Family History Family History  Problem Relation Age of Onset  . Hypertension Mother   . Diabetes Father   . Kidney disease Maternal Grandmother   . Cancer Maternal Grandfather        colon    Social History Social History   Tobacco Use  . Smoking status: Never Smoker  . Smokeless tobacco: Never Used  Substance Use Topics  . Alcohol use: No  . Drug use: No     Allergies   Morphine and related   Review of Systems Review of Systems  Constitutional: Negative for fever.  Respiratory: Negative for shortness of breath.   Cardiovascular: Negative for chest pain and syncope.  Gastrointestinal: Positive for vomiting. Negative for diarrhea.  Neurological: Positive for dizziness and headaches. Negative for weakness.  All other systems reviewed and are negative.    Physical Exam Updated Vital Signs BP 117/82 (BP Location: Left Arm)   Pulse 62   Temp 98.7 F (37.1  C) (Oral)   Resp 16   Ht 5\' 5"  (1.651 m)   Wt 81.6 kg (180 lb)   LMP 08/21/2017   SpO2 100%   BMI 29.95 kg/m   Physical Exam Physical Exam  Nursing note and vitals reviewed. Constitutional: Well developed, well nourished, non-toxic, and in no acute distress Head: Normocephalic and atraumatic.  Mouth/Throat: Oropharynx is clear and moist.  Neck: Normal range of motion. Neck supple.  Cardiovascular: Normal rate and regular rhythm.   Pulmonary/Chest: Effort normal and breath sounds normal.  Abdominal: Soft. There is no tenderness. There is no rebound and no guarding.  Musculoskeletal: Normal range of motion.  Skin: Skin is warm and dry.  Psychiatric: Cooperative Neurological:  Alert, oriented to person, place, time, and situation. Memory grossly in tact. Fluent speech. No dysarthria or aphasia.  Cranial nerves: VF are full. Pupils are symmetric, and reactive to light. EOMI with leftward beating nystagmus. No gaze deviation. Facial muscles symmetric with activation. Sensation to light touch over face in tact bilaterally. Hearing grossly in tact. Palate elevates symmetrically. Head turn and shoulder shrug are intact. Tongue midline.  Reflexes defered.  Muscle bulk and tone normal. No pronator drift. Moves all extremities symmetrically. Sensation to light touch is in tact throughout in bilateral upper and lower extremities. Coordination reveals no dysmetria with finger to nose. No abnormal test of skew. Non-localizing head impulse test     ED Treatments / Results  Labs (all labs ordered are listed, but only abnormal results are displayed) Labs Reviewed  URINALYSIS, ROUTINE W REFLEX MICROSCOPIC - Abnormal; Notable for the following components:      Result Value   Hgb urine dipstick MODERATE (*)    All other components within normal limits  BASIC METABOLIC PANEL - Abnormal; Notable for the following components:   Calcium 8.7 (*)    Anion gap 4 (*)    All other components within  normal limits  URINALYSIS, MICROSCOPIC (REFLEX) - Abnormal; Notable for the following components:   Bacteria, UA RARE (*)    Squamous Epithelial / LPF 0-5 (*)    All other components within normal limits  CBG MONITORING, ED - Abnormal; Notable for the following components:   Glucose-Capillary 113 (*)    All other components within normal limits  PREGNANCY, URINE  CBC WITH DIFFERENTIAL/PLATELET    EKG  EKG Interpretation  Date/Time:  Monday August 21 2017 17:01:55 EST Ventricular Rate:  68 PR Interval:  160 QRS Duration: 86 QT Interval:  402 QTC Calculation: 427 R Axis:   73 Text Interpretation:  Normal sinus rhythm with sinus arrhythmia Normal ECG no prior EKG  Confirmed by Crista Curb [phone]) on 08/21/2017 8:17:57 PM       Radiology Ct Head Wo Contrast  Result Date: 08/21/2017 CLINICAL DATA:  36 year old female with headache and dizziness. EXAM: CT HEAD WITHOUT CONTRAST TECHNIQUE: Contiguous axial images were obtained from  the base of the skull through the vertex without intravenous contrast. COMPARISON:  None. FINDINGS: Brain: The ventricles and sulci appropriate size for patient's age. The gray-white matter discrimination is preserved. There is no acute intracranial hemorrhage. No mass effect or midline shift noted. Small area of CSF density to the left of the quadrigeminal plate cistern may represent a dilated and prominent perivascular space or a neuroepithelial/neuroglial cyst. No extra-axial fluid collection. Vascular: No hyperdense vessel or unexpected calcification. Skull: Normal. Negative for fracture or focal lesion. Sinuses/Orbits: No acute finding. Other: None IMPRESSION: 1. No acute intracranial pathology. 2. Small CSF density to the left of the quadrigeminal plate cistern likely a dilated prevascular space or a neural epithelial/neuroglial cyst, a benign finding. Electronically Signed   By: Elgie CollardArash  Radparvar M.D.   On: 08/21/2017 19:44    Procedures Procedures  (including critical care time)  Medications Ordered in ED Medications  sodium chloride 0.9 % bolus 1,000 mL (0 mLs Intravenous Stopped 08/21/17 2031)  ketorolac (TORADOL) 30 MG/ML injection 30 mg (30 mg Intravenous Given 08/21/17 1848)  dexamethasone (DECADRON) injection 10 mg (10 mg Intravenous Given 08/21/17 1850)  promethazine (PHENERGAN) injection 25 mg (25 mg Intravenous Given 08/21/17 1847)  meclizine (ANTIVERT) tablet 25 mg (25 mg Oral Given 08/21/17 2029)  diazepam (VALIUM) injection 5 mg (5 mg Intravenous Given 08/21/17 2029)     Initial Impression / Assessment and Plan / ED Course  I have reviewed the triage vital signs and the nursing notes.  Pertinent labs & imaging results that were available during my care of the patient were reviewed by me and considered in my medical decision making (see chart for details).     36 year old female who presents with headache and dizziness, described as potentially vertigo. Neuro exam in in tact, aside from ataxic gait. No central features of her vertigo to immediately suggest cerebellar process, such as stroke.  Although she does have non-localizing head impulse test.  Given atypical nature of her headache, different from previous migraines and with vertiginous symptoms, she did undergo a CT head to rule out ICH, mass lesion, or other serious process.  This is visualized and shows no acute intracranial processes.  Blood work is reassuring. She is not pregnant.  She is given migraine cocktail, initially with Toradol, Decadron, and Phenergan.  Reports that her headaches improved but her dizziness is persistent.  Subsequently given meclizine and Valium.  On reevaluation, she states that her dizziness is now resolved.  She is able to ambulate steadily.  There is no other neurological findings.  I feel that her presentation is most consistent with a peripheral vertigo.  She will be discharged with medication management and supportive care/symptomatic  management. Strict return and follow-up instructions reviewed. She expressed understanding of all discharge instructions and felt comfortable with the plan of care.   Final Clinical Impressions(s) / ED Diagnoses   Final diagnoses:  Dizziness  Vertigo    ED Discharge Orders        Ordered    diazepam (VALIUM) 5 MG tablet  Every 8 hours PRN     08/21/17 2213    meclizine (ANTIVERT) 25 MG tablet  3 times daily PRN     08/21/17 2213       Lavera GuiseLiu, Masaji Billups Duo, MD 08/21/17 2316

## 2017-10-15 IMAGING — US US MFM FETAL BPP W/O NON-STRESS
1 series · 14 of 20 positions shown · non-contrast
Comparison: none

[Series 1: us mfm fetal bpp w/o non-stress · 20 acquisitions, 14 frames shown]
[im 1/20]
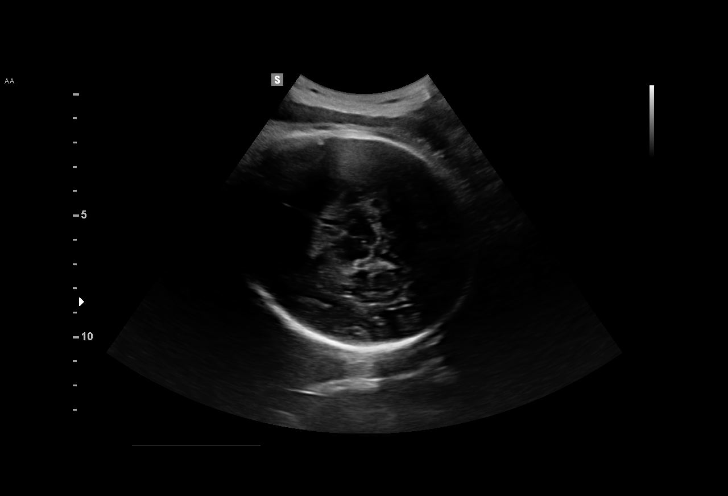
[im 3/20]
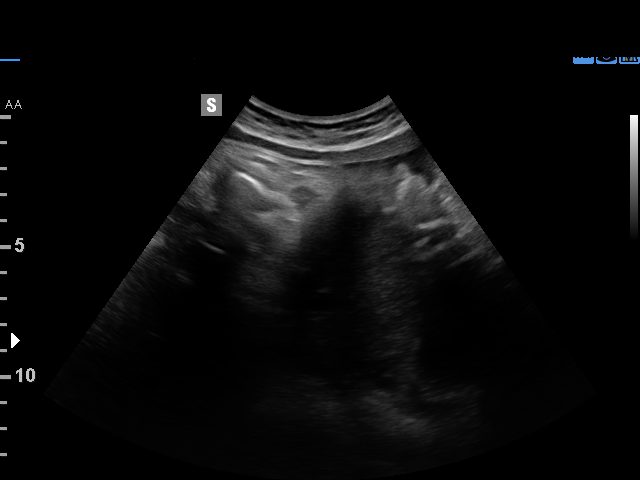
[im 4/20]
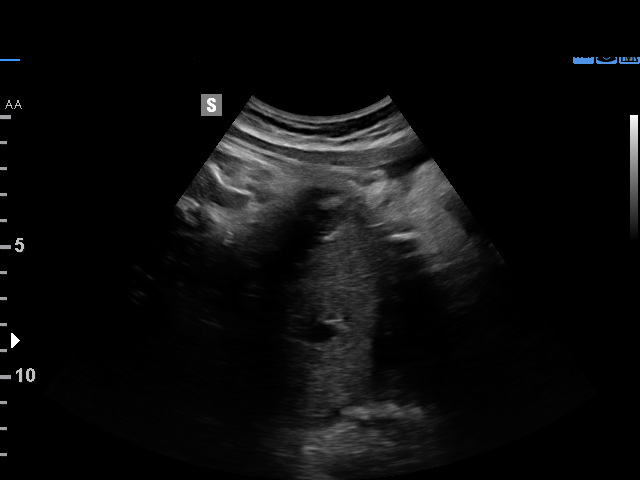
[im 6/20]
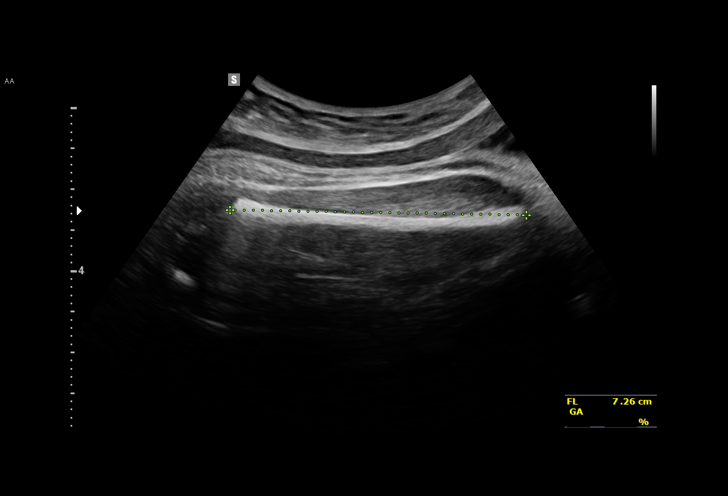
[im 7/20]
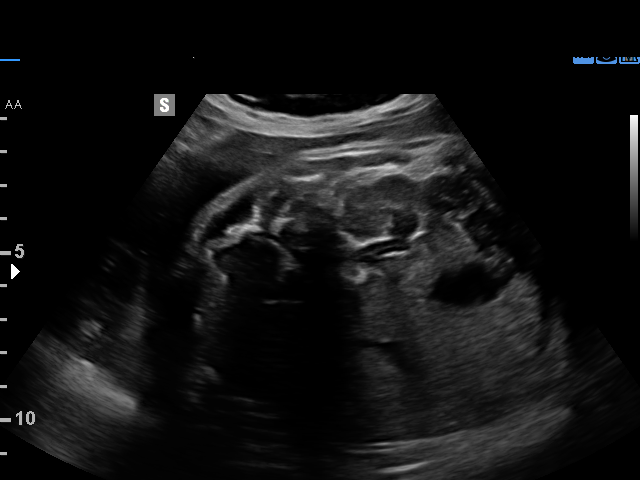
[im 8/20]
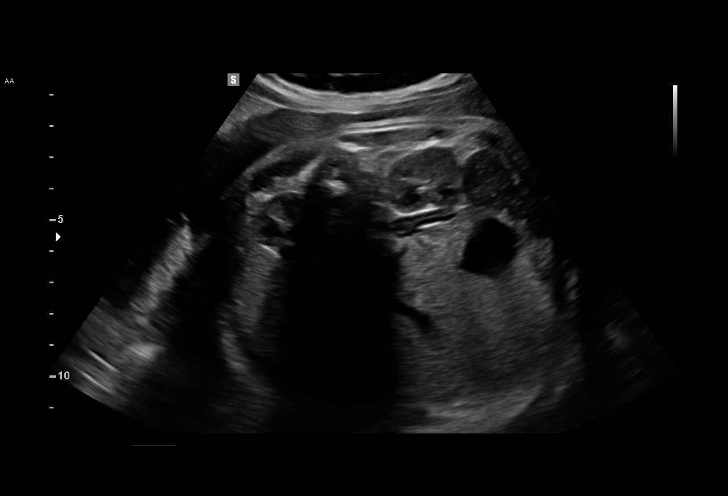
[im 10/20]
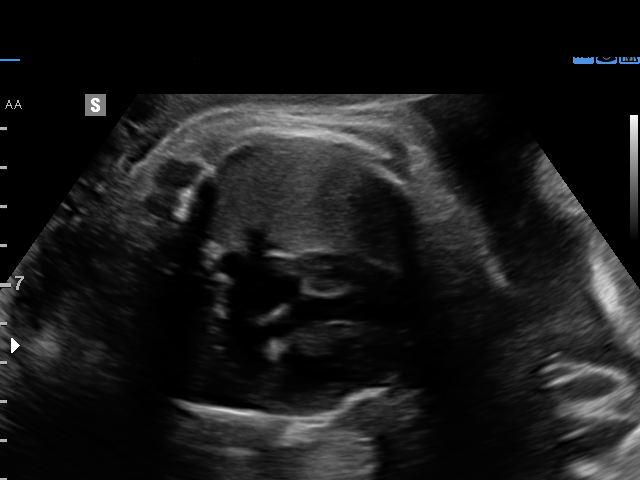
[im 11/20]
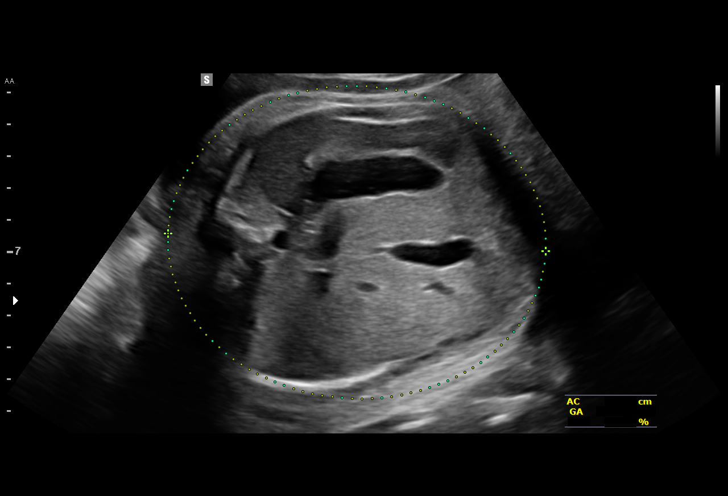
[im 13/20]
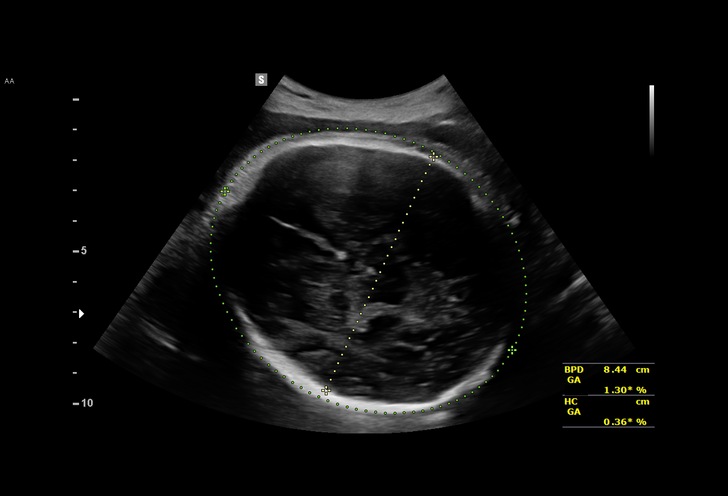
[im 14/20]
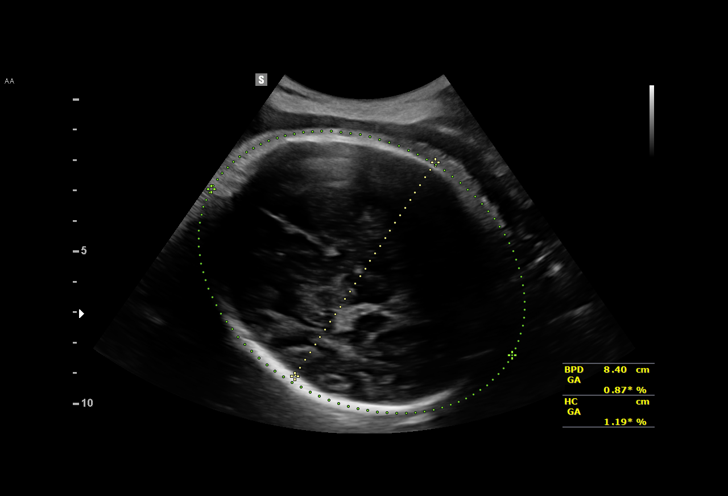
[im 16/20]
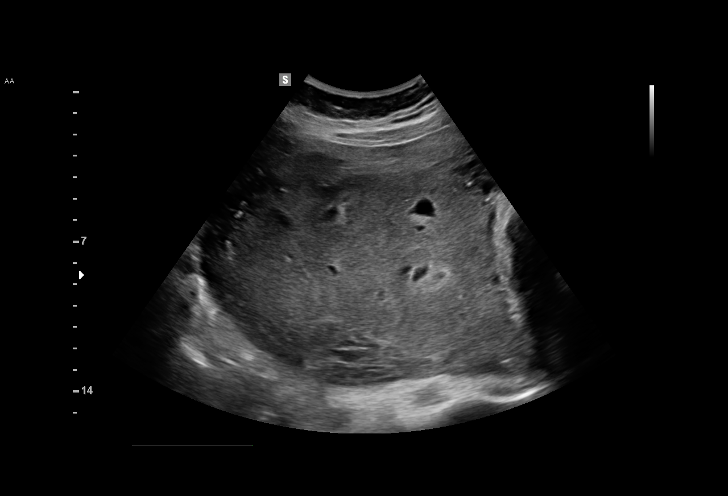
[im 17/20]
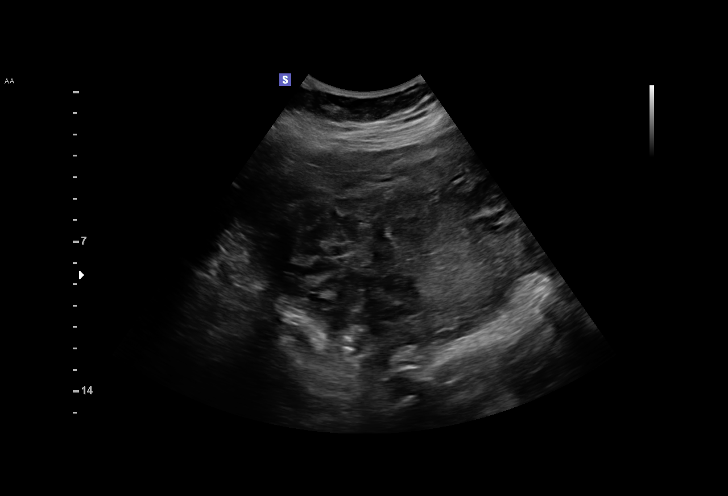
[im 18/20]
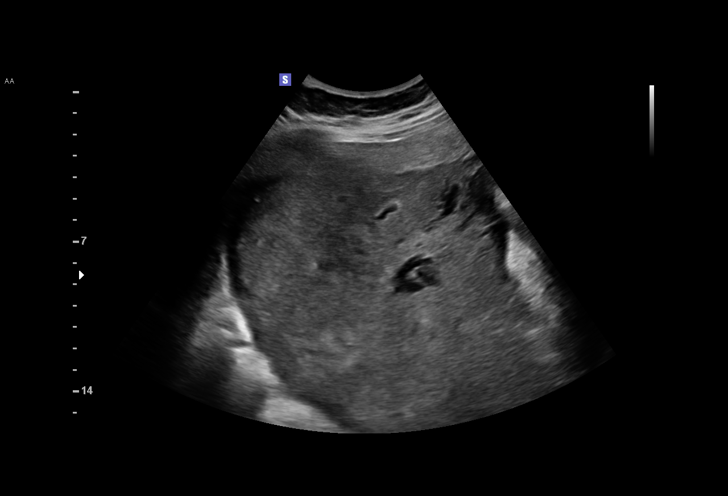
[im 20/20]
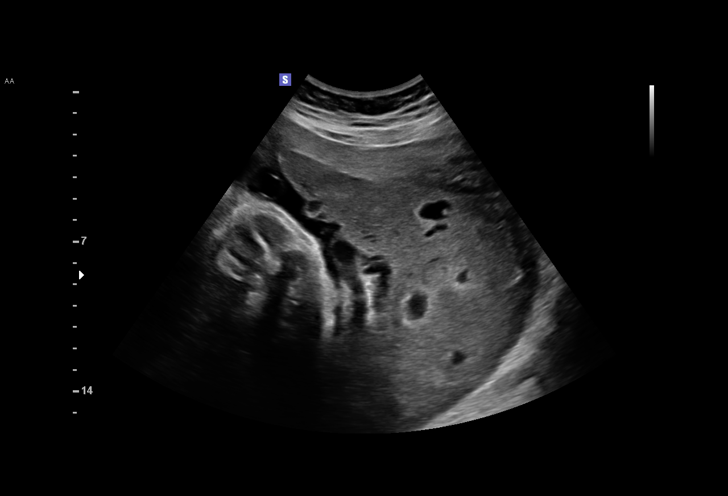

[14 of 20 positions shown; findings below may reference images not displayed]

[facility]yn Clinic
[REDACTED]-
Faculty Physician

1  SOK EISENMAN           [id number]      [phone]     [id number]
2  GEIDY HORTA             [id number]      [phone]     [id number]
Indications

37 weeks gestation of pregnancy
Poor obstetric history: Previous IUFD (20
week demise, placental abruption)
Poor obstetric history: Previous preterm
delivery (36 weeks)
Poor obstetric history: Previous eclampsia
on ASA (37 week delivery)
Gestational diabetes in pregnancy,
controlled by oral hypoglycemic drugs
OB History

Gravidity:    4         Term:   1        Prem:   2         SAB:   0
TOP:          0       Ectopic:  0        Living: 2
Fetal Evaluation

Num Of Fetuses:     1
Fetal Heart         142
Rate(bpm):
Cardiac Activity:   Observed
Presentation:       Cephalic
Placenta:           Posterior, above cervical os

Amniotic Fluid
AFI FV:      Subjectively within normal limits
AFI Sum(cm)     %Tile       Largest Pocket(cm)
12.01           40

RUQ(cm)       RLQ(cm)       LUQ(cm)        LLQ(cm)
3.86
Biophysical Evaluation

Amniotic F.V:   Within normal limits       F. Tone:         Observed
F. Movement:    Observed                   Score:           [DATE]
F. Breathing:   Observed
Biometry

BPD:        84  mm     G. Age:  33w 6d          1  %    CI:         74.58  %    70 - 86
FL/HC:       23.5  %    20.9 -
HC:      308.7  mm     G. Age:  34w 3d        < 3  %    HC/AC:       0.90       0.92 -
AC:      342.9  mm     G. Age:  38w 1d         78  %    FL/BPD:      86.4  %    71 - 87
FL:       72.6  mm     G. Age:  37w 1d         38  %    FL/AC:       21.2  %    20 - 24

Est. FW:    3799   gm    6 lb 13 oz     59  %
Gestational Age

U/S Today:     35w 6d                                        EDD:    04/15/16
Best:          37w 5d     Det. By:  Early Ultrasound         EDD:    04/02/16
(08/26/15)
Anatomy

Cranium:               Appears normal         Aortic Arch:            Previously seen
Cavum:                 Previously seen        Ductal Arch:            Previously seen
Ventricles:            Previously seen        Diaphragm:              Previously seen
Choroid Plexus:        Previously seen        Stomach:                Appears normal, left
sided
Cerebellum:            Previously seen        Abdomen:                Previously seen
Posterior Fossa:       Previously seen        Abdominal Wall:         Previously seen
Nuchal Fold:           Previously seen        Cord Vessels:           Previously seen
Face:                  Orbits and profile     Kidneys:                Appear normal
previously seen
Lips:                  Previously seen        Bladder:                Appears normal
Thoracic:              Appears normal         Spine:                  Previously seen
Heart:                 Previously seen        Upper Extremities:      Previously seen
RVOT:                  Previously seen        Lower Extremities:      Previously seen
LVOT:                  Previously seen

Other:  Fetus appears to be a female. Heels and 5th digit visualized
previously. Nasal bone visualized previously.
Impression

SIUP at 37+5 weeks
Cephalic presentation
Normal interval anatomy; anatomic survey complete
Normal amniotic fluid volume
Appropriate interval growth with EFW at the 59th %tile
BPP [DATE]
Recommendations

Continue twice weekly NSTs with weekly AFIs
Induction planned for 39 weeks

## 2019-03-25 NOTE — Letter (Signed)
Patient Letter               Thank you for choosing us for your health care. Below is the results of your recent testing. For any questions please contact us using the information provided above.    Signature Line

## 2019-03-26 LAB — COVID-19: SARS COV2, NAA (BD): DETECTED — AB
